# Patient Record
Sex: Female | Born: 1935 | Race: Black or African American | Hispanic: No | Smoking: Former smoker
Health system: Southern US, Community
[De-identification: ages and names within clinical notes are randomized; demographics above are authoritative.]

## PROBLEM LIST (undated history)

## (undated) DIAGNOSIS — E875 Hyperkalemia: Secondary | ICD-10-CM

## (undated) DIAGNOSIS — I10 Essential (primary) hypertension: Secondary | ICD-10-CM

## (undated) DIAGNOSIS — J449 Chronic obstructive pulmonary disease, unspecified: Secondary | ICD-10-CM

## (undated) DIAGNOSIS — F419 Anxiety disorder, unspecified: Secondary | ICD-10-CM

## (undated) DIAGNOSIS — K219 Gastro-esophageal reflux disease without esophagitis: Secondary | ICD-10-CM

## (undated) DIAGNOSIS — F4310 Post-traumatic stress disorder, unspecified: Secondary | ICD-10-CM

## (undated) DIAGNOSIS — M24519 Contracture, unspecified shoulder: Secondary | ICD-10-CM

## (undated) DIAGNOSIS — F0390 Unspecified dementia without behavioral disturbance: Secondary | ICD-10-CM

## (undated) DIAGNOSIS — K922 Gastrointestinal hemorrhage, unspecified: Secondary | ICD-10-CM

## (undated) DIAGNOSIS — N390 Urinary tract infection, site not specified: Secondary | ICD-10-CM

## (undated) DIAGNOSIS — F319 Bipolar disorder, unspecified: Secondary | ICD-10-CM

## (undated) DIAGNOSIS — D649 Anemia, unspecified: Secondary | ICD-10-CM

## (undated) DIAGNOSIS — M109 Gout, unspecified: Secondary | ICD-10-CM

## (undated) DIAGNOSIS — M810 Age-related osteoporosis without current pathological fracture: Secondary | ICD-10-CM

## (undated) DIAGNOSIS — N289 Disorder of kidney and ureter, unspecified: Secondary | ICD-10-CM

## (undated) HISTORY — DX: Essential (primary) hypertension: I10

## (undated) HISTORY — PX: TOTAL ABDOMINAL HYSTERECTOMY: SHX209

## (undated) HISTORY — DX: Bipolar disorder, unspecified: F31.9

## (undated) HISTORY — DX: Post-traumatic stress disorder, unspecified: F43.10

---

## 2007-10-11 ENCOUNTER — Ambulatory Visit (HOSPITAL_COMMUNITY): Admission: RE | Admit: 2007-10-11 | Discharge: 2007-10-11 | Payer: Self-pay | Admitting: Ophthalmology

## 2007-11-08 ENCOUNTER — Ambulatory Visit (HOSPITAL_COMMUNITY): Admission: RE | Admit: 2007-11-08 | Discharge: 2007-11-08 | Payer: Self-pay | Admitting: Ophthalmology

## 2008-09-05 ENCOUNTER — Emergency Department (HOSPITAL_COMMUNITY): Admission: EM | Admit: 2008-09-05 | Discharge: 2008-09-05 | Payer: Self-pay | Admitting: Emergency Medicine

## 2008-12-12 ENCOUNTER — Ambulatory Visit (HOSPITAL_COMMUNITY): Admission: RE | Admit: 2008-12-12 | Discharge: 2008-12-12 | Payer: Self-pay | Admitting: Internal Medicine

## 2009-07-12 HISTORY — PX: COLONOSCOPY: SHX174

## 2009-07-25 ENCOUNTER — Ambulatory Visit: Payer: Self-pay | Admitting: Gastroenterology

## 2009-07-25 DIAGNOSIS — K5909 Other constipation: Secondary | ICD-10-CM

## 2009-07-30 ENCOUNTER — Encounter: Payer: Self-pay | Admitting: Gastroenterology

## 2009-07-31 ENCOUNTER — Ambulatory Visit: Payer: Self-pay | Admitting: Gastroenterology

## 2009-07-31 ENCOUNTER — Ambulatory Visit (HOSPITAL_COMMUNITY): Admission: RE | Admit: 2009-07-31 | Discharge: 2009-07-31 | Payer: Self-pay | Admitting: Gastroenterology

## 2009-08-17 ENCOUNTER — Emergency Department (HOSPITAL_COMMUNITY): Admission: EM | Admit: 2009-08-17 | Discharge: 2009-08-17 | Payer: Self-pay | Admitting: Emergency Medicine

## 2009-10-08 ENCOUNTER — Encounter (INDEPENDENT_AMBULATORY_CARE_PROVIDER_SITE_OTHER): Payer: Self-pay | Admitting: *Deleted

## 2009-11-07 ENCOUNTER — Ambulatory Visit: Payer: Self-pay | Admitting: Gastroenterology

## 2009-12-13 ENCOUNTER — Ambulatory Visit (HOSPITAL_COMMUNITY): Admission: RE | Admit: 2009-12-13 | Discharge: 2009-12-13 | Payer: Self-pay | Admitting: Internal Medicine

## 2010-01-10 HISTORY — PX: UPPER GASTROINTESTINAL ENDOSCOPY: SHX188

## 2010-01-22 ENCOUNTER — Emergency Department (HOSPITAL_COMMUNITY): Admission: EM | Admit: 2010-01-22 | Discharge: 2010-01-22 | Payer: Self-pay | Admitting: Emergency Medicine

## 2010-02-02 ENCOUNTER — Emergency Department (HOSPITAL_COMMUNITY): Admission: EM | Admit: 2010-02-02 | Discharge: 2010-02-02 | Payer: Self-pay | Admitting: Emergency Medicine

## 2010-02-03 ENCOUNTER — Inpatient Hospital Stay (HOSPITAL_COMMUNITY): Admission: EM | Admit: 2010-02-03 | Discharge: 2010-02-13 | Payer: Self-pay | Admitting: Emergency Medicine

## 2010-02-05 ENCOUNTER — Ambulatory Visit: Payer: Self-pay | Admitting: Internal Medicine

## 2010-02-06 ENCOUNTER — Encounter (INDEPENDENT_AMBULATORY_CARE_PROVIDER_SITE_OTHER): Payer: Self-pay | Admitting: Internal Medicine

## 2010-02-06 ENCOUNTER — Ambulatory Visit: Payer: Self-pay | Admitting: Gastroenterology

## 2010-03-14 ENCOUNTER — Encounter: Payer: Self-pay | Admitting: Internal Medicine

## 2010-04-02 ENCOUNTER — Encounter: Payer: Self-pay | Admitting: Gastroenterology

## 2010-04-23 ENCOUNTER — Encounter (INDEPENDENT_AMBULATORY_CARE_PROVIDER_SITE_OTHER): Payer: Self-pay

## 2010-05-27 ENCOUNTER — Ambulatory Visit: Payer: Self-pay | Admitting: Gastroenterology

## 2010-05-27 ENCOUNTER — Encounter: Payer: Self-pay | Admitting: Urgent Care

## 2010-05-27 DIAGNOSIS — M79609 Pain in unspecified limb: Secondary | ICD-10-CM | POA: Insufficient documentation

## 2010-07-01 ENCOUNTER — Ambulatory Visit (HOSPITAL_COMMUNITY): Admission: RE | Admit: 2010-07-01 | Discharge: 2010-07-01 | Payer: Self-pay | Admitting: Internal Medicine

## 2010-08-11 ENCOUNTER — Encounter: Payer: Self-pay | Admitting: Urgent Care

## 2010-08-22 ENCOUNTER — Emergency Department (HOSPITAL_COMMUNITY): Admission: EM | Admit: 2010-08-22 | Discharge: 2010-08-22 | Payer: Self-pay | Admitting: Emergency Medicine

## 2010-11-02 ENCOUNTER — Encounter: Payer: Self-pay | Admitting: Internal Medicine

## 2010-11-11 NOTE — Medication Information (Signed)
Summary: MEAMUCIL  MEAMUCIL   Imported By: Rexene Alberts 08/11/2010 15:36:30  _____________________________________________________________________  External Attachment:    Type:   Image     Comment:   External Document  Appended Document: MEAMUCIL Ok Benefiber 2 by mouth daily , QS x 1 month, with 11 refills  Appended Document: MEAMUCIL Informed Lupita Leash at [facility], and called Rx to [facility].

## 2010-11-11 NOTE — Medication Information (Signed)
Summary: BENEFIBER SUGAR FREE  BENEFIBER SUGAR FREE   Imported By: Rexene Alberts 05/27/2010 14:42:07  _____________________________________________________________________  External Attachment:    Type:   Image     Comment:   External Document  Appended Document: BENEFIBER SUGAR FREE   Prescriptions: BENEFIBER  POWD (WHEAT DEXTRIN) 2 tsp in 6 oz of liquid daily  #QS x 1 month x 11   Entered and Authorized by:   Joselyn Arrow FNP-BC   Signed by:   Joselyn Arrow FNP-BC on 05/27/2010   Method used:   Printed then faxed to ...       [facility] (retail)       [address]       [place], [place]  [address]       Ph: [phone]       Fax: [phone]   Rx[facility]:   [id number]  Please fax to [facility].  Thanks

## 2010-11-11 NOTE — Assessment & Plan Note (Signed)
Summary: CONSTIPATION, GASTRITIS   Visit Type:  Follow-up Visit Primary Care Provider:  Felecia Shelling, M.D.  Chief Complaint:  Gastritis and Constipation.  History of Present Illness: No belly pain. Eating good. BMs: good. No vomiting or nausea. On fiber once daily. Rarely has to ask fo extra meds for BM.  Current Medications (verified): 1)  Omeprazole 20 Mg Cpdr (Omeprazole) .... Take 1 Tablet By Mouth Once A Day 2)  Divalproex Sodium 125 Mg Cpsp (Divalproex Sodium) .... Take Two Capsules By Mouth Twice Daily 3)  Simvastatin 20 Mg Tabs (Simvastatin) .... Take 1 Tab By Mouth At Bedtime 4)  Amlodipine Besylate 5 Mg Tabs (Amlodipine Besylate) .... Take 1 Tablet By Mouth Once A Day 5)  Benefiber  Powd (Wheat Dextrin) .... 2 Tsp in 6 Oz of Liquid Daily 6)  Clotrimazole 1 % Crea (Clotrimazole) .... As Directed 7)  Namenda 10 Mg Tabs (Memantine Hcl) .... Take 1 Tablet By Mouth Once A Day 8)  Zyprexa 2.5 Mg Tabs (Olanzapine) .... Take 1 Tablet By Mouth Once A Day 9)  Zyprexa 5 Mg Tabs (Olanzapine) .... Take 1 Tab By Mouth At Bedtime 10)  Exelon 4.6 Mg/24hr Pt24 (Rivastigmine) .... As Directed 11)  Tramadol Hcl 50 Mg Tabs (Tramadol Hcl) .... As Needed For Pain 12)  Acetaminophen 325 Mg Tabs (Acetaminophen) .... As Needed  Allergies (verified): No Known Drug Allergies  Past History:  Past Surgical History: Last updated: 07/25/2009 None  Past Medical History: After attack has problems with focusing and thinking straight BIPOLAR DISORDER POST TRAUMATIC STRESS DISORDER Hypertension 2011: EGD-NSAID GASTRITIS 2010: TCS- TICS, [facility] IH  Social History: Reviewed history from 07/25/2009 and no changes required. 1 daughter in [place]. Her niece, Arlington Calix, brought her to [place] so she can look after her. She is a Engineer, civil (consulting) at [employer]. No EtOh or tobacco. Quit smoking 2 years ago. Lives at [place].  Vital Signs:  Patient profile:   77 year old female Height:      64 inches Weight:       203 pounds Temp:     98.0 degrees F oral Pulse rate:   64 / minute BP sitting:   154 / 84  (left arm) Cuff size:   large  Vitals Entered By: Cloria Spring LPN (May 27, 2010 9:38 AM)  Physical Exam  General:  Well developed, well nourished, no acute distress. Head:  Normocephalic and atraumatic. Lungs:  Clear throughout to auscultation. Heart:  Regular rate and rhythm; no murmurs. Abdomen:  Bowel sounds normal.  Abdomen is soft, nontender, nondistended.  No rebound or guarding.   Pt examined in chair.  Impression & Recommendations:  Problem # 1:  CONSTIPATION, CHRONIC (ICD-564.09) Assessment Improved Continue Benefiber daily.  OPV in 12 mos.  CC: PCP  Problem # 2:  LEG PAIN, BILATERAL (ICD-729.5) Assessment: Comment Only See Dr. Felecia Shelling for leg pain and swelling.  Appended Document: CONSTIPATION, GASTRITIS 12 MONTH REMINDER OV IS IN THE COMPUTER/LAW  Appended Document: CONSTIPATION, GASTRITIS 1 YR REMINDER FOR OV F/U IS IN THE COMPUTER/LAW  Appended Document: Orders Update    Clinical Lists Changes  Orders: Added new Service order of Est. Patient Level II (09811) - Signed

## 2010-11-11 NOTE — Letter (Signed)
Summary: Recall Office Visit  [facility]  [address]   [place], [place] [address]   Phone: [phone]  Fax: [phone]      April 23, 2010   Carly Valenzuela [address] [place], [place]  [address] 1931/07/07   Dear [name],   According to our records, it is time for you to schedule a follow-up office visit with [place].   At your convenience, please call [phone] to schedule an office visit. If you have any questions, concerns, or feel that this letter is in error, we would appreciate your call.   Sincerely,    Hendricks Limes LPN  [facility] Ph: [phone]   Fax: [phone]

## 2010-11-11 NOTE — Letter (Signed)
Summary: Internal Other /fax to [facility]  Internal Other /fax to [facility]   Imported By: Cloria Spring LPN 95/62/1308 65:78:46  _____________________________________________________________________  External Attachment:    Type:   Image     Comment:   External Document

## 2010-11-11 NOTE — Consult Note (Signed)
Summary: Consultation Report  Consultation Report   Imported By: Minna Merritts 03/14/2010 16:19:11  _____________________________________________________________________  External Attachment:    Type:   Image     Comment:   External Document

## 2010-11-11 NOTE — Assessment & Plan Note (Signed)
Summary: FU OV 3 MO,SCREENING COLORECTAL,CONSTIPATION/AMS   Visit Type:  f/u Primary Care Provider:  Avon Gully  Chief Complaint:  follow up-constipation.  History of Present Illness: Ms. Carly Valenzuela is here for followup of constipation. She underwent colonoscopy by Dr. Darrick Penna in October 2010 and was found to have diverticulosis and internal hemorrhoids. For her constipation she was started on Benefiber 2 teaspoons 3 times a day. She complains of frequent loose stools since Benefiber started. She is afraid to leave her bedroom. She denies any abdominal pain, black stools, blood in stools, nausea vomiting, heartburn.  Current Medications (verified): 1)  Hydrocodone-Acetaminophen 5-500 Mg Tabs (Hydrocodone-Acetaminophen) .... Take 1 Tablet By Mouth Two Times A Day 2)  Levocarnitine 330 Mg Tabs (Levocarnitine) .... Take 1 Tablet By Mouth Three Times A Day 3)  Seroquel 25 Mg Tabs (Quetiapine Fumarate) .... Take 1 Tab By Mouth At Bedtime 4)  Omeprazole 20 Mg Cpdr (Omeprazole) .... Take 1 Tablet By Mouth Once A Day 5)  Lisinopril-Hydrochlorothiazide 20-25 Mg Tabs (Lisinopril-Hydrochlorothiazide) .... Take 1 Tablet By Mouth Once A Day 6)  Divalproex Sodium 125 Mg Cpsp (Divalproex Sodium) .... Take Three Capsules By Mouth Twice Daily 7)  Simvastatin 20 Mg Tabs (Simvastatin) .... Take 1 Tab By Mouth At Bedtime 8)  Amlodipine Besylate 5 Mg Tabs (Amlodipine Besylate) .... Take 1 Tablet By Mouth Once A Day 9)  Naprosyn 375 Mg Tabs (Naproxen) .... Take 1 Tablet By Mouth Two Times A Day As Needed For Pain 10)  Milk of Magnesia .... Take 10 Ml's Three Times A Day As  Needed For Constipation 11)  Benefiber  Powd (Wheat Dextrin) .... 2 Tsp By Mouth Tid  Allergies (verified): No Known Drug Allergies  Review of Systems      See HPI  Vital Signs:  Patient profile:   77 year old female Height:      64 inches Weight:      205 pounds BMI:     35.32 Temp:     97.5 degrees F oral Pulse rate:   64  / minute BP sitting:   142 / 80  (left arm) Cuff size:   regular  Vitals Entered By: Hendricks Limes LPN (November 07, 2009 10:42 AM)  Physical Exam  General:  Well developed, well nourished, no acute distress. Head:  Normocephalic and atraumatic. Eyes:  Sclera nonicteric. Mouth:  OP moist. Abdomen:  Bowel sounds normal.  Abdomen is soft, nontender, nondistended.  No rebound or guarding.  No hepatosplenomegaly, masses or hernias.  No abdominal bruits.  Extremities:  No clubbing, cyanosis, edema or deformities noted. Neurologic:  Alert and  oriented x4;  grossly normal neurologically. Skin:  Intact without significant lesions or rashes. Psych:  Alert and cooperative. Normal mood and affect.  Impression & Recommendations:  Problem # 1:  CONSTIPATION, CHRONIC (ICD-564.09)  Increased frequency of stools on Benefiber.  Change in once daily dose. If persistent loose stools, will back off completely. OV in 6 months with Dr. Darrick Penna.  Orders: Est. Patient Level II ([address]109)

## 2010-11-21 ENCOUNTER — Encounter (HOSPITAL_COMMUNITY): Payer: Self-pay

## 2010-11-21 ENCOUNTER — Other Ambulatory Visit (HOSPITAL_COMMUNITY): Payer: Self-pay | Admitting: Internal Medicine

## 2010-11-21 ENCOUNTER — Ambulatory Visit (HOSPITAL_COMMUNITY)
Admission: RE | Admit: 2010-11-21 | Discharge: 2010-11-21 | Disposition: A | Discharge status: Home / Self Care | Payer: Medicare Other | Source: Ambulatory Visit | Attending: Internal Medicine | Admitting: Internal Medicine

## 2010-11-21 DIAGNOSIS — M25569 Pain in unspecified knee: Secondary | ICD-10-CM | POA: Insufficient documentation

## 2010-11-21 DIAGNOSIS — M899 Disorder of bone, unspecified: Secondary | ICD-10-CM | POA: Insufficient documentation

## 2010-11-21 DIAGNOSIS — M1990 Unspecified osteoarthritis, unspecified site: Secondary | ICD-10-CM

## 2010-12-23 LAB — URINALYSIS, ROUTINE W REFLEX MICROSCOPIC
Glucose, UA: NEGATIVE mg/dL
Hgb urine dipstick: NEGATIVE
Protein, ur: NEGATIVE mg/dL
Specific Gravity, Urine: 1.01 (ref 1.005–1.030)

## 2010-12-23 LAB — COMPREHENSIVE METABOLIC PANEL
ALT: 14 U/L (ref 0–35)
AST: 20 U/L (ref 0–37)
Albumin: 4.1 g/dL (ref 3.5–5.2)
Alkaline Phosphatase: 133 U/L — ABNORMAL HIGH (ref 39–117)
GFR calc Af Amer: 47 mL/min — ABNORMAL LOW (ref >60)
Sodium: 134 mEq/L — ABNORMAL LOW (ref 135–145)
Total Bilirubin: 0.6 mg/dL (ref 0.3–1.2)
Total Protein: 7.9 g/dL (ref 6.0–8.3)

## 2010-12-23 LAB — CBC
HCT: 35.4 % — ABNORMAL LOW (ref 36.0–46.0)
Hemoglobin: 11.5 g/dL — ABNORMAL LOW (ref 12.0–15.0)
MCH: 27.3 pg (ref 26.0–34.0)
MCHC: 32.4 g/dL (ref 30.0–36.0)
MCV: 84.1 fL (ref 78.0–100.0)
Platelets: 203 10*3/uL (ref 150–400)
RDW: 15.1 % (ref 11.5–15.5)

## 2010-12-23 LAB — DIFFERENTIAL
Basophils Absolute: 0 10*3/uL (ref 0.0–0.1)
Eosinophils Absolute: 0.1 10*3/uL (ref 0.0–0.7)
Monocytes Absolute: 0.4 10*3/uL (ref 0.1–1.0)
Monocytes Relative: 8 % (ref 3–12)

## 2010-12-23 LAB — RAPID URINE DRUG SCREEN, HOSP PERFORMED
Barbiturates: NOT DETECTED
Cocaine: NOT DETECTED
Opiates: NOT DETECTED
Tetrahydrocannabinol: NOT DETECTED

## 2010-12-30 LAB — DIFFERENTIAL
Basophils Absolute: 0 10*3/uL (ref 0.0–0.1)
Basophils Absolute: 0 10*3/uL (ref 0.0–0.1)
Basophils Relative: 0 % (ref 0–1)
Basophils Relative: 0 % (ref 0–1)
Basophils Relative: 0 % (ref 0–1)
Basophils Relative: 0 % (ref 0–1)
Eosinophils Absolute: 0.2 10*3/uL (ref 0.0–0.7)
Eosinophils Absolute: 0 10*3/uL (ref 0.0–0.7)
Eosinophils Absolute: 0.2 10*3/uL (ref 0.0–0.7)
Eosinophils Absolute: 0.2 10*3/uL (ref 0.0–0.7)
Eosinophils Relative: 7 % — ABNORMAL HIGH (ref 0–5)
Eosinophils Relative: 0 % (ref 0–5)
Lymphocytes Relative: 24 % (ref 12–46)
Lymphs Abs: 1.1 10*3/uL (ref 0.7–4.0)
Lymphs Abs: 0.7 10*3/uL (ref 0.7–4.0)
Lymphs Abs: 0.8 10*3/uL (ref 0.7–4.0)
Lymphs Abs: 1 10*3/uL (ref 0.7–4.0)
Lymphs Abs: 1 10*3/uL (ref 0.7–4.0)
Monocytes Absolute: 0.2 10*3/uL (ref 0.1–1.0)
Monocytes Absolute: 0.5 10*3/uL (ref 0.1–1.0)
Monocytes Absolute: 0.8 10*3/uL (ref 0.1–1.0)
Monocytes Absolute: 0.9 10*3/uL (ref 0.1–1.0)
Monocytes Relative: 16 % — ABNORMAL HIGH (ref 3–12)
Monocytes Relative: 16 % — ABNORMAL HIGH (ref 3–12)
Monocytes Relative: 6 % (ref 3–12)
Neutro Abs: 4.6 10*3/uL (ref 1.7–7.7)
Neutro Abs: 3 10*3/uL (ref 1.7–7.7)
Neutrophils Relative %: 54 % (ref 43–77)
Neutrophils Relative %: 59 % (ref 43–77)
Neutrophils Relative %: 69 % (ref 43–77)
Neutrophils Relative %: 87 % — ABNORMAL HIGH (ref 43–77)

## 2010-12-30 LAB — URINE CULTURE: Colony Count: >=100000

## 2010-12-30 LAB — BASIC METABOLIC PANEL
CO2: 25 mEq/L (ref 19–32)
CO2: 25 mEq/L (ref 19–32)
CO2: 25 mEq/L (ref 19–32)
CO2: 27 mEq/L (ref 19–32)
CO2: 27 mEq/L (ref 19–32)
Calcium: 9.6 mg/dL (ref 8.4–10.5)
Calcium: 9.1 mg/dL (ref 8.4–10.5)
Chloride: 109 mEq/L (ref 96–112)
Chloride: 100 mEq/L (ref 96–112)
Chloride: 105 mEq/L (ref 96–112)
Chloride: 106 mEq/L (ref 96–112)
Creatinine, Ser: 1.6 mg/dL — ABNORMAL HIGH (ref 0.4–1.2)
Creatinine, Ser: 1.58 mg/dL — ABNORMAL HIGH (ref 0.4–1.2)
GFR calc Af Amer: 48 mL/min — ABNORMAL LOW (ref >60)
GFR calc Af Amer: 38 mL/min — ABNORMAL LOW (ref >60)
GFR calc Af Amer: 49 mL/min — ABNORMAL LOW (ref >60)
Glucose, Bld: 121 mg/dL — ABNORMAL HIGH (ref 70–99)
Glucose, Bld: 125 mg/dL — ABNORMAL HIGH (ref 70–99)
Potassium: 3.6 mEq/L (ref 3.5–5.1)
Potassium: 3.8 mEq/L (ref 3.5–5.1)
Sodium: 142 mEq/L (ref 135–145)
Sodium: 134 mEq/L — ABNORMAL LOW (ref 135–145)
Sodium: 139 mEq/L (ref 135–145)
Sodium: 140 mEq/L (ref 135–145)

## 2010-12-30 LAB — URINALYSIS, ROUTINE W REFLEX MICROSCOPIC
Bilirubin Urine: NEGATIVE
Bilirubin Urine: NEGATIVE
Glucose, UA: NEGATIVE mg/dL
Hgb urine dipstick: NEGATIVE
Ketones, ur: NEGATIVE mg/dL
Nitrite: POSITIVE — AB
Protein, ur: NEGATIVE mg/dL
Specific Gravity, Urine: 1.02 (ref 1.005–1.030)
Urobilinogen, UA: 0.2 mg/dL (ref 0.0–1.0)
Urobilinogen, UA: 0.2 mg/dL (ref 0.0–1.0)

## 2010-12-30 LAB — CBC
HCT: 27.6 % — ABNORMAL LOW (ref 36.0–46.0)
Hemoglobin: 8.6 g/dL — ABNORMAL LOW (ref 12.0–15.0)
Hemoglobin: 9.9 g/dL — ABNORMAL LOW (ref 12.0–15.0)
Hemoglobin: 8.6 g/dL — ABNORMAL LOW (ref 12.0–15.0)
Hemoglobin: 9.1 g/dL — ABNORMAL LOW (ref 12.0–15.0)
Hemoglobin: 9.5 g/dL — ABNORMAL LOW (ref 12.0–15.0)
Hemoglobin: 9.6 g/dL — ABNORMAL LOW (ref 12.0–15.0)
MCHC: 33.4 g/dL (ref 30.0–36.0)
MCHC: 33.8 g/dL (ref 30.0–36.0)
MCHC: 34.2 g/dL (ref 30.0–36.0)
MCHC: 34.8 g/dL (ref 30.0–36.0)
MCHC: 34.8 g/dL (ref 30.0–36.0)
MCV: 89 fL (ref 78.0–100.0)
MCV: 88.5 fL (ref 78.0–100.0)
MCV: 89 fL (ref 78.0–100.0)
Platelets: 151 10*3/uL (ref 150–400)
RBC: 2.87 MIL/uL — ABNORMAL LOW (ref 3.87–5.11)
RBC: 2.77 MIL/uL — ABNORMAL LOW (ref 3.87–5.11)
RBC: 3.03 MIL/uL — ABNORMAL LOW (ref 3.87–5.11)
RBC: 3.12 MIL/uL — ABNORMAL LOW (ref 3.87–5.11)
RBC: 3.13 MIL/uL — ABNORMAL LOW (ref 3.87–5.11)
RDW: 15.3 % (ref 11.5–15.5)
RDW: 14.4 % (ref 11.5–15.5)
RDW: 14.6 % (ref 11.5–15.5)
WBC: 4.4 10*3/uL (ref 4.0–10.5)
WBC: 5.1 10*3/uL (ref 4.0–10.5)
WBC: 5.7 10*3/uL (ref 4.0–10.5)

## 2010-12-30 LAB — URINE MICROSCOPIC-ADD ON

## 2010-12-30 LAB — CULTURE, BLOOD (SINGLE)

## 2010-12-30 LAB — COMPREHENSIVE METABOLIC PANEL
ALT: 9 U/L (ref 0–35)
AST: 14 U/L (ref 0–37)
Alkaline Phosphatase: 90 U/L (ref 39–117)
CO2: 26 mEq/L (ref 19–32)
GFR calc Af Amer: 44 mL/min — ABNORMAL LOW (ref >60)
Glucose, Bld: 107 mg/dL — ABNORMAL HIGH (ref 70–99)
Potassium: 3.5 mEq/L (ref 3.5–5.1)
Sodium: 137 mEq/L (ref 135–145)
Total Protein: 6.1 g/dL (ref 6.0–8.3)

## 2010-12-30 LAB — VITAMIN B12: Vitamin B-12: 487 pg/mL (ref 211–911)

## 2010-12-30 LAB — TSH: TSH: 3.017 u[IU]/mL (ref 0.350–4.500)

## 2010-12-30 LAB — RETICULOCYTES
Retic Count, Absolute: 69.5 10*3/uL (ref 19.0–186.0)
Retic Ct Pct: 2.5 % (ref 0.4–3.1)

## 2010-12-30 LAB — VALPROIC ACID LEVEL: Valproic Acid Lvl: 95 ug/mL (ref 50.0–100.0)

## 2010-12-30 LAB — FOLATE: Folate: 4.8 ng/mL

## 2010-12-31 LAB — BASIC METABOLIC PANEL
BUN: 39 mg/dL — ABNORMAL HIGH (ref 6–23)
CO2: 26 mEq/L (ref 19–32)
Chloride: 105 mEq/L (ref 96–112)
Creatinine, Ser: 1.68 mg/dL — ABNORMAL HIGH (ref 0.4–1.2)
Potassium: 4.1 mEq/L (ref 3.5–5.1)

## 2010-12-31 LAB — CBC
HCT: 31.3 % — ABNORMAL LOW (ref 36.0–46.0)
MCHC: 33.8 g/dL (ref 30.0–36.0)
MCV: 89.3 fL (ref 78.0–100.0)
Platelets: 130 10*3/uL — ABNORMAL LOW (ref 150–400)
WBC: 4.5 10*3/uL (ref 4.0–10.5)

## 2010-12-31 LAB — URINE MICROSCOPIC-ADD ON

## 2010-12-31 LAB — URINE CULTURE

## 2010-12-31 LAB — URINALYSIS, ROUTINE W REFLEX MICROSCOPIC
Bilirubin Urine: NEGATIVE
Glucose, UA: NEGATIVE mg/dL
Hgb urine dipstick: NEGATIVE
Specific Gravity, Urine: 1.015 (ref 1.005–1.030)
pH: 7 (ref 5.0–8.0)

## 2010-12-31 LAB — POCT CARDIAC MARKERS

## 2010-12-31 LAB — DIFFERENTIAL
Basophils Relative: 1 % (ref 0–1)
Eosinophils Absolute: 0.1 10*3/uL (ref 0.0–0.7)
Eosinophils Relative: 3 % (ref 0–5)
Lymphs Abs: 1.5 10*3/uL (ref 0.7–4.0)
Monocytes Relative: 8 % (ref 3–12)
Neutrophils Relative %: 54 % (ref 43–77)

## 2010-12-31 LAB — HEPATIC FUNCTION PANEL
Alkaline Phosphatase: 104 U/L (ref 39–117)
Indirect Bilirubin: 0.4 mg/dL (ref 0.3–0.9)
Total Bilirubin: 0.5 mg/dL (ref 0.3–1.2)
Total Protein: 7 g/dL (ref 6.0–8.3)

## 2010-12-31 LAB — LIPASE, BLOOD: Lipase: 20 U/L (ref 11–59)

## 2011-01-12 ENCOUNTER — Other Ambulatory Visit (HOSPITAL_COMMUNITY): Payer: Self-pay | Admitting: Internal Medicine

## 2011-01-12 DIAGNOSIS — Z139 Encounter for screening, unspecified: Secondary | ICD-10-CM

## 2011-01-19 ENCOUNTER — Ambulatory Visit (HOSPITAL_COMMUNITY)
Admission: RE | Admit: 2011-01-19 | Discharge: 2011-01-19 | Disposition: A | Discharge status: Home / Self Care | Payer: Medicare Other | Source: Ambulatory Visit | Attending: Internal Medicine | Admitting: Internal Medicine

## 2011-01-19 DIAGNOSIS — Z1231 Encounter for screening mammogram for malignant neoplasm of breast: Secondary | ICD-10-CM | POA: Insufficient documentation

## 2011-01-19 DIAGNOSIS — Z139 Encounter for screening, unspecified: Secondary | ICD-10-CM

## 2011-01-22 ENCOUNTER — Telehealth: Payer: Self-pay

## 2011-01-22 NOTE — Telephone Encounter (Signed)
Called to [facility] [place] at [facility].

## 2011-01-22 NOTE — Telephone Encounter (Signed)
Yes

## 2011-01-22 NOTE — Telephone Encounter (Signed)
Fax from [facility]. Benefiber caplets unavailable. Is it OK to change to Metamucil caps  2 po daily?

## 2011-02-17 MED ORDER — INULIN 2 G PO CHEW: 2.0000 g | CHEWABLE_TABLET | Freq: Two times a day (BID) | ORAL | Status: AC

## 2011-02-17 NOTE — Telephone Encounter (Signed)
Addended by: Lorenza Burton on: 02/17/2011 12:04 PM   Modules accepted: Orders

## 2011-02-17 NOTE — Telephone Encounter (Signed)
Called Rx to [facility] @ [facility].

## 2011-02-17 NOTE — Telephone Encounter (Signed)
Metamucil caps not available per pharm Will change

## 2011-05-14 ENCOUNTER — Encounter: Payer: Self-pay | Admitting: Gastroenterology

## 2011-07-02 ENCOUNTER — Encounter: Payer: Self-pay | Admitting: Gastroenterology

## 2011-07-02 ENCOUNTER — Ambulatory Visit (INDEPENDENT_AMBULATORY_CARE_PROVIDER_SITE_OTHER): Payer: Medicare Other | Admitting: Gastroenterology

## 2011-07-02 DIAGNOSIS — K5909 Other constipation: Secondary | ICD-10-CM

## 2011-07-02 DIAGNOSIS — M25561 Pain in right knee: Secondary | ICD-10-CM

## 2011-07-02 DIAGNOSIS — M25569 Pain in unspecified knee: Secondary | ICD-10-CM

## 2011-07-02 DIAGNOSIS — M25562 Pain in left knee: Secondary | ICD-10-CM | POA: Insufficient documentation

## 2011-07-02 MED ORDER — ACETAMINOPHEN 325 MG PO TABS: 650.0000 mg | ORAL_TABLET | ORAL | Status: AC | PRN

## 2011-07-02 MED ORDER — IBUPROFEN 400 MG PO TABS: ORAL_TABLET | ORAL | Status: DC

## 2011-07-02 NOTE — Progress Notes (Signed)
  Subjective:    Patient ID: Carly Valenzuela, female    DOB: 1931/04/26, 77 y.o.   MRN: [id number]  PCP: Felecia Shelling  HPI Pt seen in follow up for constipation. Not having good BMs on MLX qd. Spoke with [name] and she  states pt sleeps during the day and is up at night. Pt c/o bilateral knee pain. Pt unable to give a complete Hx.   Past Medical History  Diagnosis Date  . Bipolar 1 disorder   . PTSD (post-traumatic stress disorder)   . HTN (hypertension)    Past Surgical History  Procedure Date  . Colonoscopy OCT 2010    [place] TICS, SML IH  . Upper gastrointestinal endoscopy APR 2011    NSAID GASTRITIS, INFLAMMATORY POLYP  . Total abdominal hysterectomy     No Known Allergies  Current Outpatient Prescriptions  Medication Sig Dispense Refill  . acetaminophen (TYLENOL) 325 MG tablet Take 2 tablets (650 mg total) by mouth every 4 (four) hours as needed for pain. 2 PO Q6H PRN FOR KNEE PAIN  100 tablet  11  . divalproex (DEPAKOTE SPRINKLE) 125 MG capsule Take 125 mg by mouth 2 (two) times daily.       . EXELON 4.6 MG/24HR Place 1 patch onto the skin daily.       . furosemide (LASIX) 40 MG tablet Take 40 mg by mouth 2 (two) times daily.       Marland Kitchen ibuprofen (ADVIL,MOTRIN) 400 MG tablet 1 PO BID FOR 10 DAYS WITH FOOD OR MILK THEN AS NEEDED FOR KNEE PAIN    . Inulin (FIBERCHOICE) 2 G CHEW Chew 1 tablet (2 g total) by mouth 2 (two) times daily.    Marland Kitchen losartan (COZAAR) 100 MG tablet Take 100 mg by mouth daily.       . mirtazapine (REMERON) 15 MG tablet Take 15 mg by mouth at bedtime. 1/2 tablet       . NAMENDA 10 MG tablet Take 10 mg by mouth daily.       . naproxen (NAPROSYN) 500 MG tablet Take 500 mg by mouth 2 (two) times daily with a meal.       . OLANZapine (ZYPREXA) 5 MG tablet Take 5 mg by mouth at bedtime.       Marland Kitchen omeprazole (PRILOSEC) 20 MG capsule Take 20 mg by mouth daily.       . potassium chloride SA (K-DUR,KLOR-CON) 20 MEQ tablet 20 mEq daily. 2 tablets      . simvastatin (ZOCOR)  20 MG tablet Take 20 mg by mouth at bedtime.       . traMADol (ULTRAM) 50 MG tablet Take 50 mg by mouth every 6 (six) hours as needed.           Review of Systems  Unable to perform ROS      Objective:   Physical Exam  Vitals reviewed. Constitutional: She appears well-developed and well-nourished. No distress.  HENT:  Head: Normocephalic and atraumatic.  Cardiovascular: Normal rate, regular rhythm and normal heart sounds.   Pulmonary/Chest: Effort normal and breath sounds normal.  Abdominal: Soft. Bowel sounds are normal. She exhibits no distension. There is no tenderness.       Exam limited pt in the chair  Musculoskeletal: She exhibits tenderness (mild).       bil knees with mild swelling and warm to the touch  Neurological: She is alert.          Assessment & Plan:

## 2011-07-02 NOTE — Assessment & Plan Note (Signed)
2o to OA v. Gout.   Add ibu 400 mg bid for 10 days then prn WITH FOOD OR MILK. D/C NAPROXEN. TYLENOL PRN. CONTINUE OMP. OPV IN 2 WEEKS for knee pain and insomnia.

## 2011-07-02 NOTE — Progress Notes (Signed)
Cc to PCP 

## 2011-07-02 NOTE — Progress Notes (Signed)
Reminder in epic to follow up in 6 months/e30

## 2011-07-02 NOTE — Assessment & Plan Note (Signed)
Sx not ideally controlled.  Increase MLX to BID. Consider adding Amitiza. OPV in 6 mos. Discussed care with MS. Rucker and her niece, Philis Nettle.

## 2011-07-14 LAB — CBC
MCHC: 33
MCV: 87.7
Platelets: 199
WBC: 4.2

## 2011-07-14 LAB — DIFFERENTIAL
Basophils Relative: 0
Eosinophils Absolute: 0.1
Neutrophils Relative %: 65

## 2011-07-14 LAB — BASIC METABOLIC PANEL
BUN: 19
CO2: 26
Calcium: 9.4
GFR calc non Af Amer: 44 — ABNORMAL LOW
Glucose, Bld: 110 — ABNORMAL HIGH
Potassium: 5

## 2011-07-14 LAB — URINALYSIS, ROUTINE W REFLEX MICROSCOPIC
Nitrite: NEGATIVE
Specific Gravity, Urine: 1.01
pH: 6

## 2011-07-14 LAB — URINE MICROSCOPIC-ADD ON

## 2011-07-17 LAB — BASIC METABOLIC PANEL
Calcium: 8.8
GFR calc Af Amer: 50 — ABNORMAL LOW
GFR calc non Af Amer: 41 — ABNORMAL LOW
Sodium: 135

## 2011-07-17 LAB — HEMOGLOBIN AND HEMATOCRIT, BLOOD: HCT: 32.1 — ABNORMAL LOW

## 2012-01-04 ENCOUNTER — Encounter: Payer: Self-pay | Admitting: Gastroenterology

## 2012-02-05 ENCOUNTER — Other Ambulatory Visit (HOSPITAL_COMMUNITY): Payer: Self-pay | Admitting: Internal Medicine

## 2012-02-05 DIAGNOSIS — Z139 Encounter for screening, unspecified: Secondary | ICD-10-CM

## 2012-02-09 ENCOUNTER — Ambulatory Visit (HOSPITAL_COMMUNITY): Payer: Medicare Other

## 2012-02-11 ENCOUNTER — Encounter: Payer: Self-pay | Admitting: Gastroenterology

## 2012-02-11 ENCOUNTER — Ambulatory Visit (INDEPENDENT_AMBULATORY_CARE_PROVIDER_SITE_OTHER): Payer: Medicare Other | Admitting: Gastroenterology

## 2012-02-11 VITALS — BP 162/83 | HR 99 | Temp 97.4°F | Ht 62.0 in | Wt 190.4 lb

## 2012-02-11 DIAGNOSIS — K5909 Other constipation: Secondary | ICD-10-CM

## 2012-02-11 NOTE — Patient Instructions (Signed)
CONTINUE FIBER AND MIRALAX.  FOLLOW UP IN 6 MOS.

## 2012-02-11 NOTE — Progress Notes (Signed)
Reminder in epic to follow up in 6 months with SF ONLY °

## 2012-02-11 NOTE — Assessment & Plan Note (Signed)
RESOLVED WITH FIBER AND MIRALAX.  PT GAINED 11 LBS SINCE SEP 2012. OPV IN 6 MOS.

## 2012-02-11 NOTE — Progress Notes (Signed)
Faxed to PCP

## 2012-02-11 NOTE — Progress Notes (Signed)
Subjective:    Patient ID: Carly Valenzuela, female    DOB: [birth year], 78 y.o.   MRN: [id number]  PCP: FANTA  HPI BMs ARE GOOD. STILL HAVING KNEE PAIN. APPETITE GOOD. SLEEPING AT NIGHT NOW. NO QUESTIONS OR CONCERNS.  Past Medical History  Diagnosis Date  . Bipolar 1 disorder   . PTSD (post-traumatic stress disorder)   . HTN (hypertension)     Past Surgical History  Procedure Date  . Colonoscopy OCT 2010    [place] TICS, SML IH  . Upper gastrointestinal endoscopy APR 2011    NSAID GASTRITIS, INFLAMMATORY POLYP  . Total abdominal hysterectomy     No Known Allergies  Current Outpatient Prescriptions  Medication Sig Dispense Refill  . acetaminophen (TYLENOL) 325 MG tablet Take 2 tablets (650 mg total) by mouth every 4 (four) hours as needed for pain. 2 PO Q6H PRN FOR KNEE PAIN  100 tablet  11  . divalproex (DEPAKOTE SPRINKLE) 125 MG capsule Take 125 mg by mouth 2 (two) times daily.       . EXELON 4.6 MG/24HR Place 1 patch onto the skin daily.       . furosemide (LASIX) 40 MG tablet Take 40 mg by mouth 2 (two) times daily.       Marland Kitchen ibuprofen (ADVIL,MOTRIN) 400 MG tablet 1 PO BID FOR 10 DAYS WITH FOOD OR MILK THEN AS NEEDED FOR KNEE PAIN    . Inulin (FIBERCHOICE) 2 G CHEW Chew 1 tablet (2 g total) by mouth 2 (two) times daily.    Marland Kitchen losartan (COZAAR) 100 MG tablet Take 100 mg by mouth daily.       . mirtazapine (REMERON) 15 MG tablet Take 15 mg by mouth at bedtime. 1/2 tablet       . NAMENDA 10 MG tablet Take 10 mg by mouth daily.       . naproxen (NAPROSYN) 500 MG tablet Take 500 mg by mouth 2 (two) times daily with a meal.       . OLANZapine (ZYPREXA) 5 MG tablet Take 2.5 mg by mouth at bedtime.       Marland Kitchen omeprazole (PRILOSEC) 20 MG capsule Take 20 mg by mouth daily.       . polyethylene glycol (MIRALAX / GLYCOLAX) packet Take 17 g by mouth daily.      . potassium chloride SA (K-DUR,KLOR-CON) 20 MEQ tablet 20 mEq daily. 2 tablets      . simvastatin (ZOCOR) 20 MG tablet Take 20 mg by  mouth at bedtime.       . traMADol (ULTRAM) 50 MG tablet Take 50 mg by mouth every 6 (six) hours as needed.           Review of Systems     Objective:   Physical Exam  Constitutional: No distress.  HENT:  Head: Normocephalic and atraumatic.  Mouth/Throat: Oropharynx is clear and moist. No oropharyngeal exudate.  Eyes: Pupils are equal, round, and reactive to light. No scleral icterus.  Neck: Neck supple.  Cardiovascular: Normal rate, regular rhythm and normal heart sounds.   Pulmonary/Chest: Effort normal and breath sounds normal. No respiratory distress.  Abdominal: Soft. Bowel sounds are normal. She exhibits no distension. There is no tenderness.       LIMITED EXAM PT IN CHAIR   Neurological: She is alert.       NO  NEW FOCAL DEFICITS   Psychiatric: She has a normal mood and affect.  Assessment & Plan:

## 2012-02-12 IMAGING — CT CT HEAD W/O CM
3 of 6 series · 15 of 47 positions shown, 17 images · non-contrast
Comparison: 01/22/2010

CT HEAD

CLINICAL DATA: Status post , pole.

CT HEAD WITHOUT CONTRAST
CT CERVICAL SPINE WITHOUT CONTRAST
TECHNIQUE: Multidetector CT imaging of the head and cervical spine
was performed following the standard protocol without intravenous
contrast.  Multiplanar CT image reconstructions of the cervical
spine were also generated.

[Series 3: headseq 2.4 h60s · axial · 0.43mm/px · z∈[+189,+324]mm · 7 of 72 slices shown, 9 images]
[im 9/72  brain]
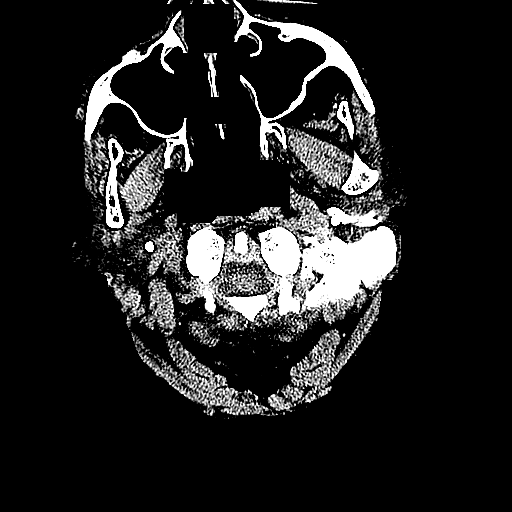
[im 9/72  bone]
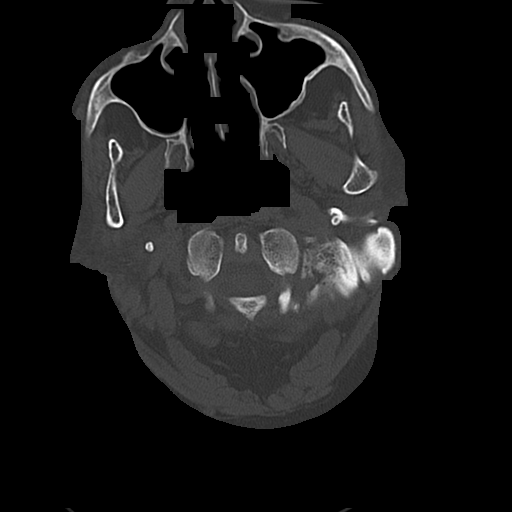
[im 18/72  brain]
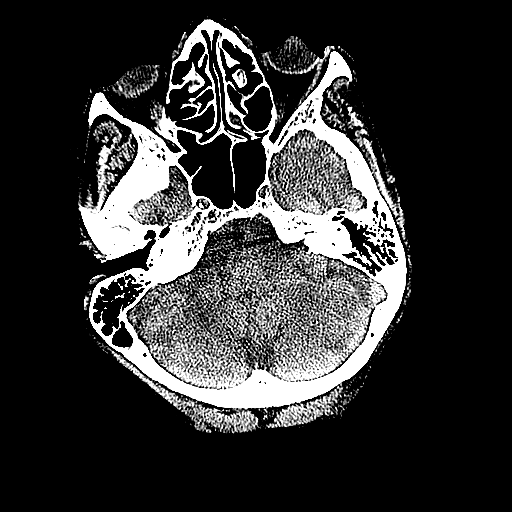
[im 27/72  brain]
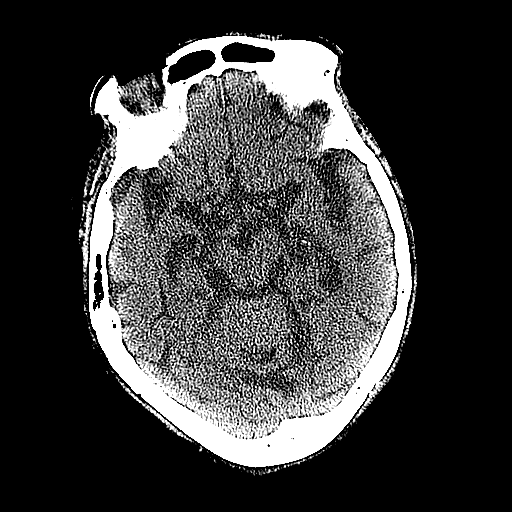
[im 36/72  brain]
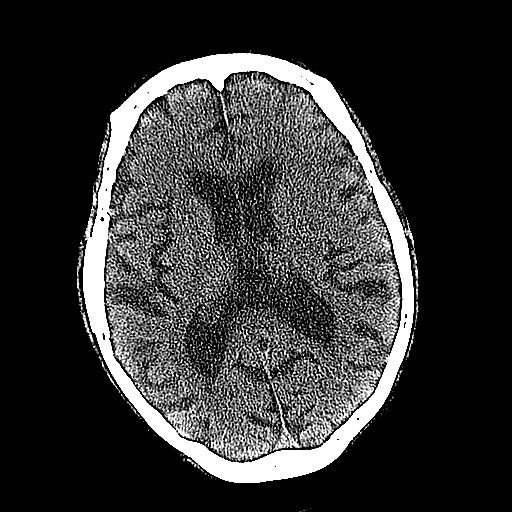
[im 45/72  brain]
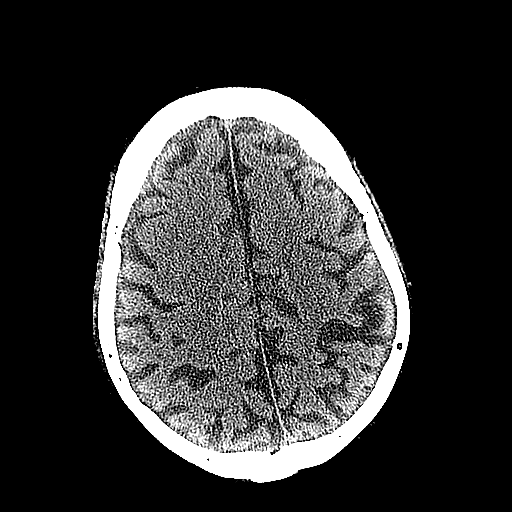
[im 45/72  bone]
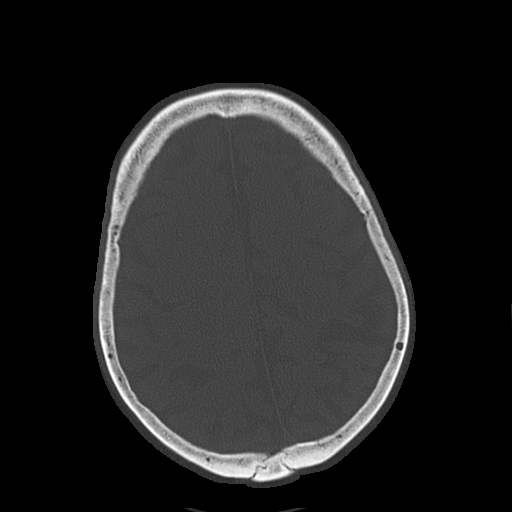
[im 54/72  brain]
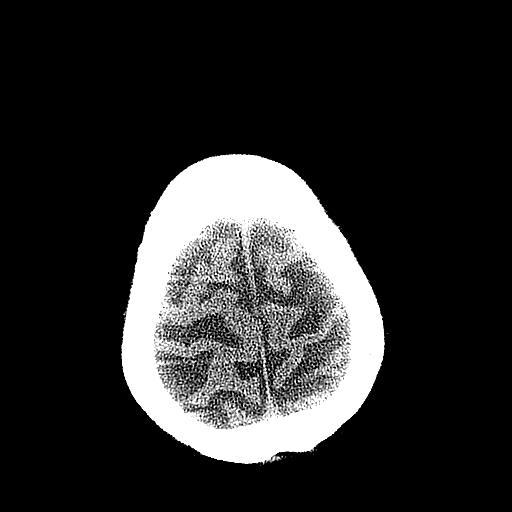
[im 63/72  brain]
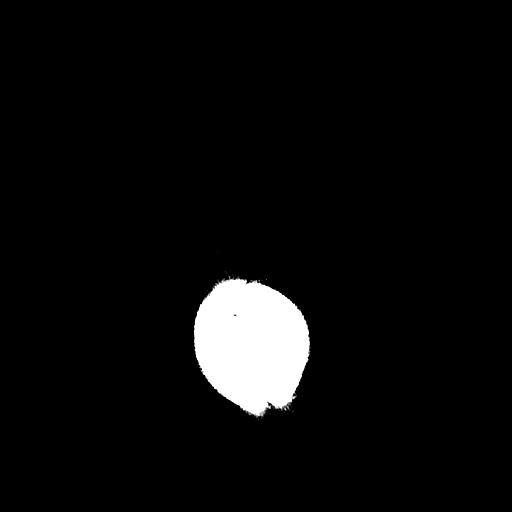

[Series 4: cervical st 2.0 b31s · axial · 0.28mm/px · z∈[+67,+137]mm · 5 of 71 slices shown]
[im 9/71  brain]
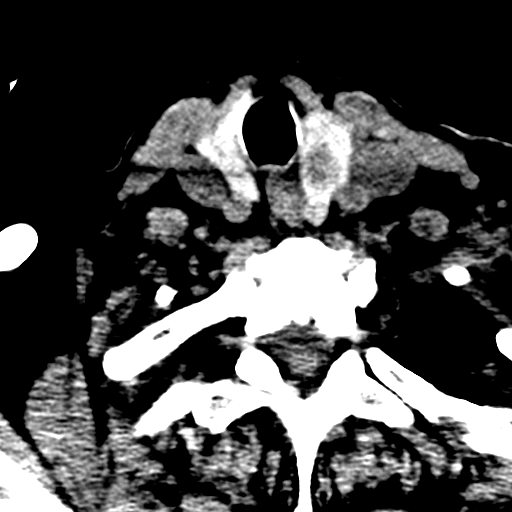
[im 18/71  brain]
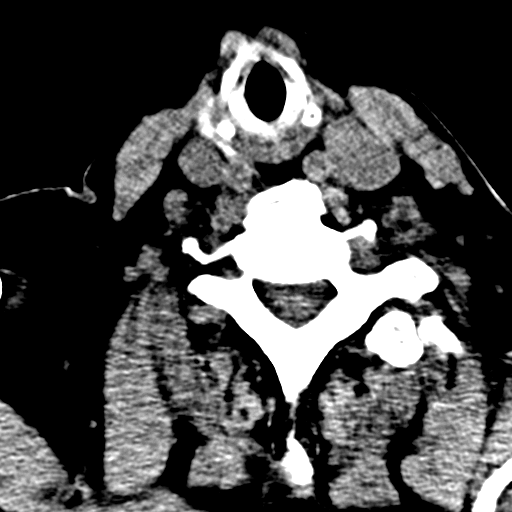
[im 27/71  brain]
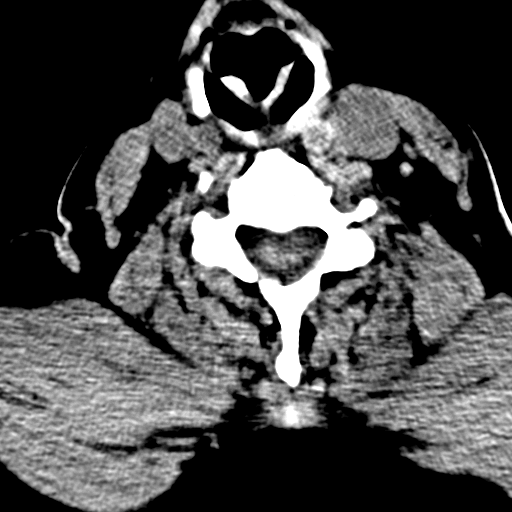
[im 36/71  brain]
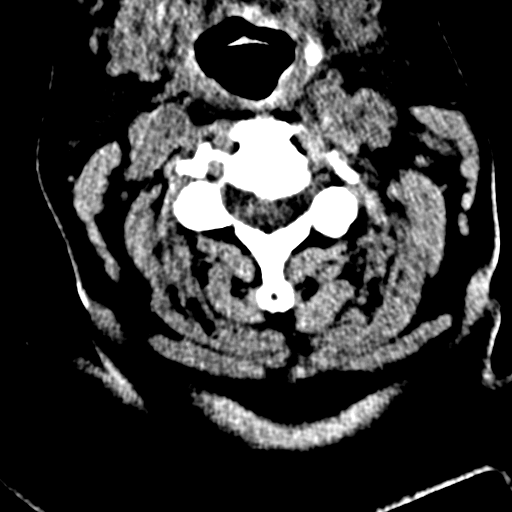
[im 44/71  brain]
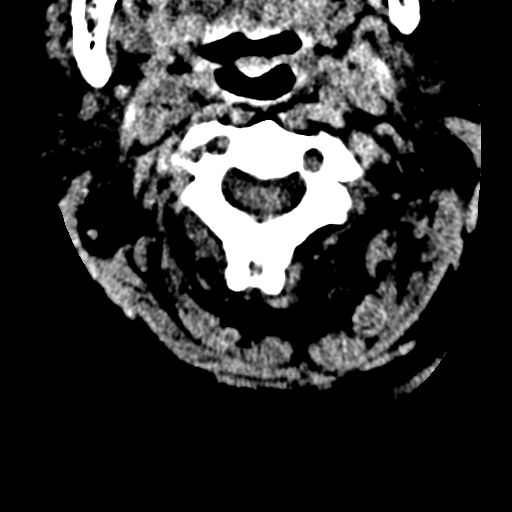

[Series 7: cervical coro (id) · coronal · 0.17mm/px · 3 of 37 slices shown]
[im 13/37  brain]
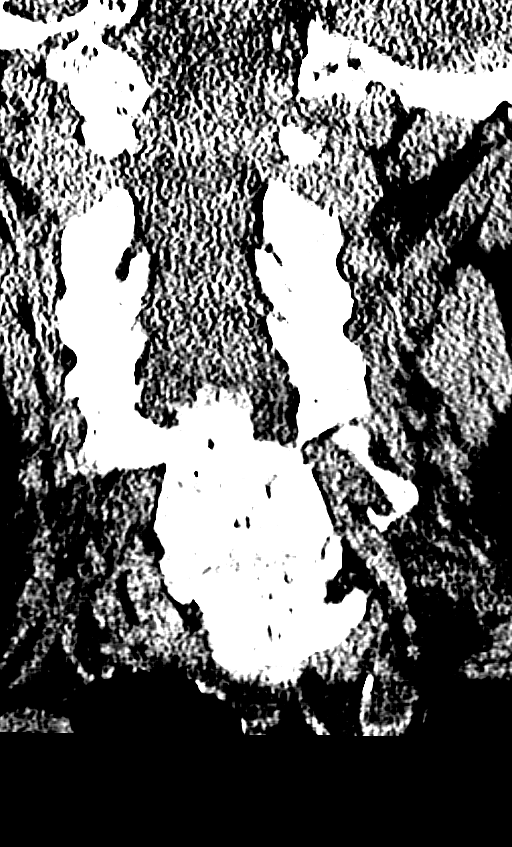
[im 17/37  brain]
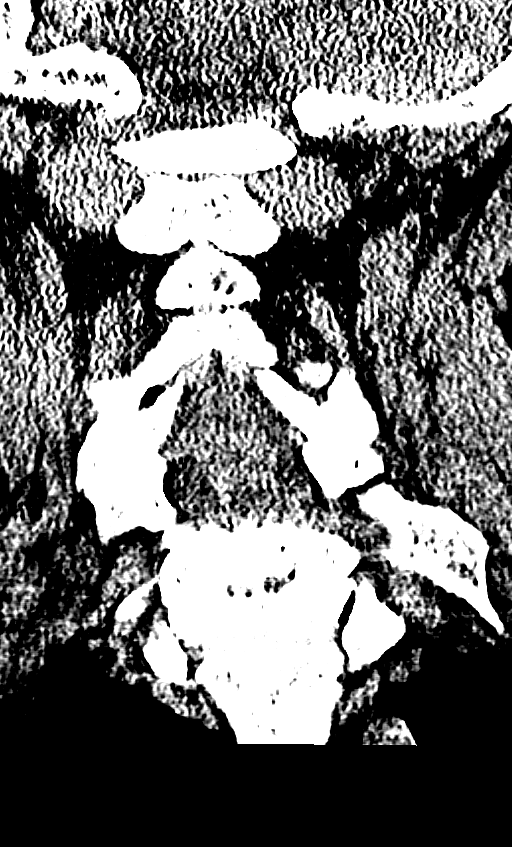
[im 21/37  brain]
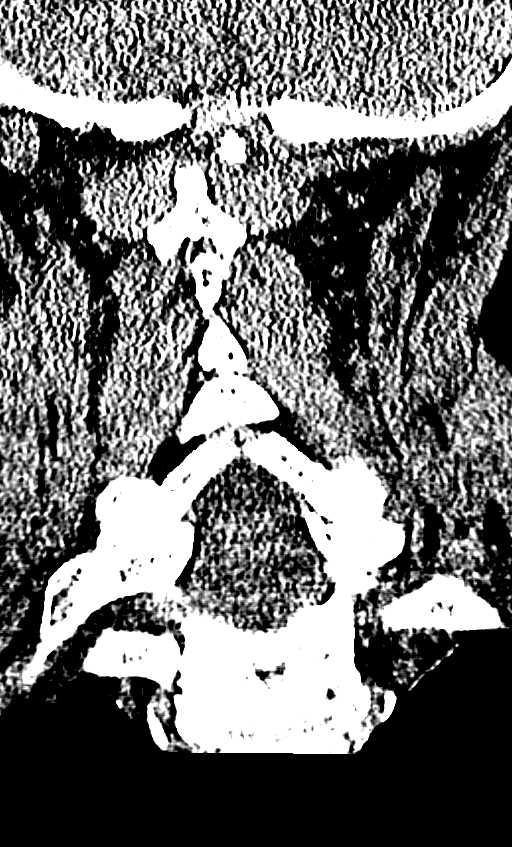

[15 of 47 positions shown; findings below may reference images not displayed]

FINDINGS: There is prominence of the sulci and ventricles
consistent with brain atrophy.

There is diffuse patchy low density throughout the subcortical and
periventricular white matter consistent with chronic small vessel
ischemic change.

There is no evidence for acute brain infarct, hemorrhage or mass.

Mild mucosal thickening involving the posterior right maxillary
sinus measures 5.3 mm, image #8.

The mastoid air cells are clear.

The skull is intact.
IMPRESSION: 1.  No acute intracranial abnormalities.
2.  Small vessel ischemic disease and brain atrophy.

CT CERVICAL SPINE
FINDINGS: The alignment of the cervical spine is normal.

The facet joints are all aligned.

The vertebral body heights are well maintained.

There is multilevel disc space narrowing and anterior spur
formation.  This is most severe at C5-6 and C6-7.

No fractures are identified.
IMPRESSION: 1.  No acute findings.

2.  Cervical spondylosis.

## 2012-07-07 ENCOUNTER — Encounter: Payer: Self-pay | Admitting: Gastroenterology

## 2012-08-03 ENCOUNTER — Ambulatory Visit (INDEPENDENT_AMBULATORY_CARE_PROVIDER_SITE_OTHER): Payer: Medicare Other | Admitting: Gastroenterology

## 2012-08-03 ENCOUNTER — Encounter: Payer: Self-pay | Admitting: Gastroenterology

## 2012-08-03 VITALS — BP 139/75 | HR 106 | Temp 97.0°F | Ht 66.0 in | Wt 206.8 lb

## 2012-08-03 DIAGNOSIS — K5909 Other constipation: Secondary | ICD-10-CM

## 2012-08-03 NOTE — Progress Notes (Signed)
Faxed to PCP

## 2012-08-03 NOTE — Assessment & Plan Note (Signed)
DOING WELL ON FIBER AND MIRALAX  CONTINUE BOTH. OPV IN ONE YEAR.

## 2012-08-03 NOTE — Progress Notes (Signed)
  Subjective:    Patient ID: Carly Valenzuela, female    DOB: 1931-07-20, 78 y.o.   MRN: [id number]  PCP: FANTA  HPI BOWELS ARE GOOD. TAKING FIBER AND MIRALAX.  Past Medical History  Diagnosis Date  . Bipolar 1 disorder   . PTSD (post-traumatic stress disorder)   . HTN (hypertension)     Past Surgical History  Procedure Date  . Colonoscopy OCT 2010    [place] TICS, SML IH  . Upper gastrointestinal endoscopy APR 2011    NSAID GASTRITIS, INFLAMMATORY POLYP  . Total abdominal hysterectomy     No Known Allergies  Current Outpatient Prescriptions  Medication Sig Dispense Refill  . divalproex (DEPAKOTE SPRINKLE) 125 MG capsule Take 125 mg by mouth 2 (two) times daily.       . EXELON 4.6 MG/24HR Place 1 patch onto the skin daily.       . furosemide (LASIX) 40 MG tablet Take 40 mg by mouth 2 (two) times daily.       Marland Kitchen ibuprofen (ADVIL,MOTRIN) 400 MG tablet 1 PO BID FOR 10 DAYS WITH FOOD OR MILK THEN AS NEEDED FOR KNEE PAIN    . Inulin (FIBERCHOICE) 2 G CHEW Chew 1 tablet (2 g total) by mouth 2 (two) times daily.    Marland Kitchen losartan (COZAAR) 100 MG tablet Take 100 mg by mouth daily.       . mirtazapine (REMERON) 15 MG tablet Take 15 mg by mouth at bedtime. 1/2 tablet       . NAMENDA 10 MG tablet Take 10 mg by mouth daily.       .        . OLANZapine (ZYPREXA) 5 MG tablet Take 2.5 mg by mouth at bedtime.       Marland Kitchen omeprazole (PRILOSEC) 20 MG capsule Take 20 mg by mouth daily.       . polyethylene glycol (MIRALAX / GLYCOLAX) packet Take 17 g by mouth daily.      . potassium chloride SA (K-DUR,KLOR-CON) 20 MEQ tablet 20 mEq daily. 2 tablets      . simvastatin (ZOCOR) 20 MG tablet Take 20 mg by mouth at bedtime.       . traMADol (ULTRAM) 50 MG tablet Take 50 mg by mouth every 6 (six) hours as needed.           Review of Systems     Objective:   Physical Exam  Vitals reviewed. Constitutional: She appears well-nourished. No distress.  HENT:  Head: Normocephalic and atraumatic.    Cardiovascular: Normal rate, regular rhythm and normal heart sounds.   Pulmonary/Chest: Effort normal and breath sounds normal. No respiratory distress.  Abdominal: Soft. Bowel sounds are normal. She exhibits no distension. There is no tenderness.       EXAM LIMITED-PT IN CHAIR   Neurological: She is alert.       NO  NEW FOCAL DEFICITS   Psychiatric: She has a normal mood and affect.          Assessment & Plan:

## 2012-08-03 NOTE — Patient Instructions (Signed)
CONTINUE MIRALAX AND FIBER.   FOLLOW UP IN ONE YEAR.  

## 2012-08-25 NOTE — Progress Notes (Signed)
Reminder in epic to follow up in one year °

## 2013-08-03 ENCOUNTER — Encounter (INDEPENDENT_AMBULATORY_CARE_PROVIDER_SITE_OTHER): Payer: Self-pay

## 2013-08-03 ENCOUNTER — Encounter: Payer: Self-pay | Admitting: Gastroenterology

## 2013-08-03 ENCOUNTER — Ambulatory Visit (INDEPENDENT_AMBULATORY_CARE_PROVIDER_SITE_OTHER): Payer: Medicare Other | Admitting: Gastroenterology

## 2013-08-03 VITALS — BP 129/72 | HR 100 | Temp 98.3°F

## 2013-08-03 DIAGNOSIS — K5909 Other constipation: Secondary | ICD-10-CM

## 2013-08-03 NOTE — Patient Instructions (Signed)
CONTINUE MIRALAX AND FIBER.   FOLLOW UP IN ONE YEAR.

## 2013-08-03 NOTE — Progress Notes (Signed)
  Subjective:    Patient ID: Carly Valenzuela, female    DOB: [birth year], 79 y.o.   MRN: [id number]  Avon Gully, MD  HPI NO QUESTIONS OR COMPLAINTS. APPETITE: EATING GOOD. PT DENIES FEVER, CHILLS, BRBPR, nausea, vomiting, melena, diarrhea, constipation, abd pain, problems swallowing, OR heartburn or indigestion.  Past Medical History  Diagnosis Date  . Bipolar 1 disorder   . PTSD (post-traumatic stress disorder)   . HTN (hypertension)    Past Surgical History  Procedure Laterality Date  . Colonoscopy  OCT 2010    [place] TICS, SML IH  . Upper gastrointestinal endoscopy  APR 2011    NSAID GASTRITIS, INFLAMMATORY POLYP  . Total abdominal hysterectomy     No Known Allergies  Current Outpatient Prescriptions  Medication Sig Dispense Refill  . divalproex (DEPAKOTE SPRINKLE) 125 MG capsule Take 125 mg by mouth 2 (two) times daily.       . EXELON 4.6 MG/24HR Place 1 patch onto the skin daily.       . furosemide (LASIX) 40 MG tablet Take 40 mg by mouth 2 (two) times daily.       Marland Kitchen ibuprofen (ADVIL,MOTRIN) 400 MG tablet 1 PO BID FOR 10 DAYS WITH FOOD OR MILK THEN AS NEEDED FOR KNEE PAIN    . Inulin (FIBERCHOICE) 2 G CHEW Chew 1 tablet (2 g total) by mouth 2 (two) times daily.    Marland Kitchen losartan (COZAAR) 100 MG tablet Take 100 mg by mouth daily.       . mirtazapine (REMERON) 15 MG tablet Take 15 mg by mouth at bedtime. 1/2 tablet       . NAMENDA 10 MG tablet Take 10 mg by mouth daily.       Marland Kitchen OLANZapine (ZYPREXA) 5 MG tablet Take 2.5 mg by mouth at bedtime.       Marland Kitchen omeprazole (PRILOSEC) 20 MG capsule Take 20 mg by mouth daily.       . polyethylene glycol (MIRALAX / GLYCOLAX) packet Take 17 g by mouth daily.      . potassium chloride SA (K-DUR,KLOR-CON) 20 MEQ tablet 20 mEq daily. 2 tablets      . simvastatin (ZOCOR) 20 MG tablet Take 20 mg by mouth at bedtime.       . traMADol (ULTRAM) 50 MG tablet Take 50 mg by mouth every 6 (six) hours as needed.           Review of Systems      Objective:   Physical Exam  Vitals reviewed. Constitutional: She appears well-nourished. No distress.  HENT:  Head: Normocephalic and atraumatic.  Mouth/Throat: No oropharyngeal exudate.  Eyes: Pupils are equal, round, and reactive to light. No scleral icterus.  Neck: Normal range of motion. Neck supple.  Cardiovascular: Normal rate, regular rhythm and normal heart sounds.   Pulmonary/Chest: Effort normal and breath sounds normal. No respiratory distress.  Abdominal: Soft. Bowel sounds are normal. She exhibits no distension. There is no tenderness.  EXAM LIMITED-PT IN CHAIR   Musculoskeletal: She exhibits no edema.  Lymphadenopathy:    She has no cervical adenopathy.  Neurological: She is alert.  NO FOCAL DEFICITS   Psychiatric: She has a normal mood and affect.          Assessment & Plan:

## 2013-08-03 NOTE — Assessment & Plan Note (Signed)
Sx controlled.  CONTINUE MIRALAX AND FIBER.  FOLLOW UP IN ONE YEAR.

## 2013-12-28 ENCOUNTER — Inpatient Hospital Stay (HOSPITAL_COMMUNITY)
Admission: EM | Admit: 2013-12-28 | Discharge: 2014-01-02 | DRG: 554 | Disposition: A | Discharge status: Skilled Nursing Facility | Payer: Medicare Other | Attending: Internal Medicine | Admitting: Internal Medicine

## 2013-12-28 ENCOUNTER — Emergency Department (HOSPITAL_COMMUNITY): Payer: Medicare Other

## 2013-12-28 ENCOUNTER — Encounter (HOSPITAL_COMMUNITY): Payer: Self-pay | Admitting: Emergency Medicine

## 2013-12-28 DIAGNOSIS — D500 Iron deficiency anemia secondary to blood loss (chronic): Secondary | ICD-10-CM | POA: Diagnosis present

## 2013-12-28 DIAGNOSIS — Z87891 Personal history of nicotine dependence: Secondary | ICD-10-CM

## 2013-12-28 DIAGNOSIS — K449 Diaphragmatic hernia without obstruction or gangrene: Secondary | ICD-10-CM | POA: Diagnosis present

## 2013-12-28 DIAGNOSIS — F4310 Post-traumatic stress disorder, unspecified: Secondary | ICD-10-CM | POA: Diagnosis present

## 2013-12-28 DIAGNOSIS — Z66 Do not resuscitate: Secondary | ICD-10-CM | POA: Diagnosis present

## 2013-12-28 DIAGNOSIS — F319 Bipolar disorder, unspecified: Secondary | ICD-10-CM | POA: Diagnosis present

## 2013-12-28 DIAGNOSIS — M25569 Pain in unspecified knee: Secondary | ICD-10-CM

## 2013-12-28 DIAGNOSIS — N179 Acute kidney failure, unspecified: Secondary | ICD-10-CM | POA: Diagnosis present

## 2013-12-28 DIAGNOSIS — M25561 Pain in right knee: Secondary | ICD-10-CM

## 2013-12-28 DIAGNOSIS — M25562 Pain in left knee: Secondary | ICD-10-CM

## 2013-12-28 DIAGNOSIS — E860 Dehydration: Secondary | ICD-10-CM | POA: Diagnosis present

## 2013-12-28 DIAGNOSIS — Z79899 Other long term (current) drug therapy: Secondary | ICD-10-CM

## 2013-12-28 DIAGNOSIS — R7989 Other specified abnormal findings of blood chemistry: Secondary | ICD-10-CM | POA: Diagnosis present

## 2013-12-28 DIAGNOSIS — E7800 Pure hypercholesterolemia, unspecified: Secondary | ICD-10-CM | POA: Diagnosis present

## 2013-12-28 DIAGNOSIS — I10 Essential (primary) hypertension: Secondary | ICD-10-CM | POA: Diagnosis present

## 2013-12-28 DIAGNOSIS — N390 Urinary tract infection, site not specified: Secondary | ICD-10-CM | POA: Diagnosis present

## 2013-12-28 DIAGNOSIS — F0390 Unspecified dementia without behavioral disturbance: Secondary | ICD-10-CM | POA: Diagnosis present

## 2013-12-28 DIAGNOSIS — M109 Gout, unspecified: Principal | ICD-10-CM | POA: Diagnosis present

## 2013-12-28 DIAGNOSIS — K922 Gastrointestinal hemorrhage, unspecified: Secondary | ICD-10-CM | POA: Diagnosis present

## 2013-12-28 HISTORY — DX: Unspecified dementia, unspecified severity, without behavioral disturbance, psychotic disturbance, mood disturbance, and anxiety: F03.90

## 2013-12-28 HISTORY — DX: Anemia, unspecified: D64.9

## 2013-12-28 LAB — CBC WITH DIFFERENTIAL/PLATELET
BASOS PCT: 0 % (ref 0–1)
Basophils Absolute: 0 10*3/uL (ref 0.0–0.1)
EOS ABS: 0.1 10*3/uL (ref 0.0–0.7)
Eosinophils Relative: 1 % (ref 0–5)
HEMATOCRIT: 24.5 % — AB (ref 36.0–46.0)
HEMOGLOBIN: 7.3 g/dL — AB (ref 12.0–15.0)
LYMPHS ABS: 0.9 10*3/uL (ref 0.7–4.0)
Lymphocytes Relative: 12 % (ref 12–46)
MCH: 26 pg (ref 26.0–34.0)
MCHC: 29.8 g/dL — AB (ref 30.0–36.0)
MCV: 87.2 fL (ref 78.0–100.0)
MONO ABS: 0.4 10*3/uL (ref 0.1–1.0)
MONOS PCT: 6 % (ref 3–12)
NEUTROS PCT: 81 % — AB (ref 43–77)
Neutro Abs: 6.2 10*3/uL (ref 1.7–7.7)
Platelets: 298 10*3/uL (ref 150–400)
RBC: 2.81 MIL/uL — AB (ref 3.87–5.11)
RDW: 16.3 % — ABNORMAL HIGH (ref 11.5–15.5)
WBC: 7.6 10*3/uL (ref 4.0–10.5)

## 2013-12-28 LAB — PROTIME-INR
INR: 1.19 (ref 0.00–1.49)
Prothrombin Time: 14.8 seconds (ref 11.6–15.2)

## 2013-12-28 LAB — ABO/RH: ABO/RH(D): O POS

## 2013-12-28 LAB — URIC ACID: URIC ACID, SERUM: 11.7 mg/dL — AB (ref 2.4–7.0)

## 2013-12-28 LAB — D-DIMER, QUANTITATIVE: D-Dimer, Quant: 1.81 ug/mL-FEU — ABNORMAL HIGH (ref 0.00–0.48)

## 2013-12-28 LAB — PREPARE RBC (CROSSMATCH)

## 2013-12-28 MED ORDER — ACETAMINOPHEN 325 MG PO TABS
650.0000 mg | ORAL_TABLET | Freq: Four times a day (QID) | ORAL | Status: DC | PRN
  Administered 2014-01-01: 650 mg via ORAL
  Filled 2013-12-28: qty 2

## 2013-12-28 MED ORDER — RIVASTIGMINE 4.6 MG/24HR TD PT24
4.6000 mg | MEDICATED_PATCH | Freq: Every day | TRANSDERMAL | Status: DC
  Administered 2013-12-29 – 2014-01-02 (×5): 4.6 mg via TRANSDERMAL
  Filled 2013-12-28 (×6): qty 1

## 2013-12-28 MED ORDER — MIRTAZAPINE 15 MG PO TABS
7.5000 mg | ORAL_TABLET | Freq: Every day | ORAL | Status: DC
  Administered 2013-12-29 – 2014-01-01 (×5): 7.5 mg via ORAL
  Filled 2013-12-28 (×4): qty 0.5
  Filled 2013-12-28: qty 1
  Filled 2013-12-28: qty 0.5

## 2013-12-28 MED ORDER — ONDANSETRON HCL 4 MG PO TABS: 4.0000 mg | ORAL_TABLET | Freq: Four times a day (QID) | ORAL | Status: DC | PRN

## 2013-12-28 MED ORDER — MIRTAZAPINE 15 MG PO TBDP
ORAL_TABLET | ORAL | Status: AC
  Filled 2013-12-28: qty 1

## 2013-12-28 MED ORDER — LOSARTAN POTASSIUM 50 MG PO TABS
100.0000 mg | ORAL_TABLET | Freq: Every day | ORAL | Status: DC
  Administered 2013-12-29 – 2014-01-02 (×5): 100 mg via ORAL
  Filled 2013-12-28 (×5): qty 2

## 2013-12-28 MED ORDER — SODIUM CHLORIDE 0.9 % IV SOLN: 250.0000 mL | INTRAVENOUS | Status: DC | PRN

## 2013-12-28 MED ORDER — POLYETHYLENE GLYCOL 3350 17 G PO PACK
17.0000 g | PACK | Freq: Every day | ORAL | Status: DC
  Administered 2013-12-29 – 2014-01-01 (×4): 17 g via ORAL
  Filled 2013-12-28 (×5): qty 1

## 2013-12-28 MED ORDER — SIMVASTATIN 20 MG PO TABS
20.0000 mg | ORAL_TABLET | Freq: Every day | ORAL | Status: DC
  Administered 2013-12-29 – 2014-01-01 (×5): 20 mg via ORAL
  Filled 2013-12-28 (×5): qty 1

## 2013-12-28 MED ORDER — MEMANTINE HCL 10 MG PO TABS
10.0000 mg | ORAL_TABLET | Freq: Every day | ORAL | Status: DC
  Administered 2013-12-29 – 2014-01-02 (×5): 10 mg via ORAL
  Filled 2013-12-28 (×5): qty 1

## 2013-12-28 MED ORDER — SODIUM CHLORIDE 0.9 % IJ SOLN
3.0000 mL | Freq: Two times a day (BID) | INTRAMUSCULAR | Status: DC
  Administered 2013-12-29 – 2014-01-01 (×8): 3 mL via INTRAVENOUS

## 2013-12-28 MED ORDER — ONDANSETRON HCL 4 MG/2ML IJ SOLN: 4.0000 mg | Freq: Four times a day (QID) | INTRAMUSCULAR | Status: DC | PRN

## 2013-12-28 MED ORDER — COLCHICINE 0.6 MG PO TABS
0.6000 mg | ORAL_TABLET | Freq: Two times a day (BID) | ORAL | Status: AC
  Administered 2013-12-29 – 2013-12-31 (×6): 0.6 mg via ORAL
  Filled 2013-12-28 (×6): qty 1

## 2013-12-28 MED ORDER — DIVALPROEX SODIUM 125 MG PO CPSP
250.0000 mg | ORAL_CAPSULE | Freq: Two times a day (BID) | ORAL | Status: DC
  Administered 2013-12-29 – 2014-01-02 (×10): 250 mg via ORAL
  Filled 2013-12-28 (×13): qty 2

## 2013-12-28 MED ORDER — SODIUM CHLORIDE 0.9 % IJ SOLN: 3.0000 mL | INTRAMUSCULAR | Status: DC | PRN

## 2013-12-28 NOTE — ED Notes (Signed)
Pt repositioned in bed. Warm blankets applied. US being done at bedsside

## 2013-12-28 NOTE — ED Notes (Signed)
Pt from [facility], per group home pt's rt foot swelling and painful. Pt poor historian.

## 2013-12-28 NOTE — ED Provider Notes (Signed)
CSN: [id number]     Arrival date & time 12/28/13  1504 History   First MD Initiated Contact with Patient 12/28/13 1512     Chief Complaint  Patient presents with  . Foot Pain    right      HPI   Pt from [facility].  EMS called 2/2 "right foot swelling".  Also, apparently, pts PCP "has been trying to get her into a NH".  Pt in wheelchair at arrival of EMS to Home.  Reportedy, pt unable to ambulate on RLE today.  Minimally/Non-verbal.  History of Bipolar, PTSD, Htn, Constipation, Dimentia, Hypercholesterol. No injury per [facility] report.   Past Medical History  Diagnosis Date  . Bipolar 1 disorder   . PTSD (post-traumatic stress disorder)   . HTN (hypertension)    Past Surgical History  Procedure Laterality Date  . Colonoscopy  OCT 2010    [place] TICS, SML IH  . Upper gastrointestinal endoscopy  APR 2011    NSAID GASTRITIS, INFLAMMATORY POLYP  . Total abdominal hysterectomy     Family History  Problem Relation Age of Onset  . Colon cancer Neg Hx   . Colon polyps Neg Hx    History  Substance Use Topics  . Smoking status: Former Smoker -- 0.50 packs/day    Types: Cigarettes  . Smokeless tobacco: Not on file     Comment: years ago  . Alcohol Use: No   OB History   Grav Para Term Preterm Abortions TAB SAB Ect Mult Living                 Review of Systems  Unable to perform ROS: Patient nonverbal      Allergies  Review of patient's allergies indicates no known allergies.  Home Medications   Current Outpatient Rx  Name  Route  Sig  Dispense  Refill  . divalproex (DEPAKOTE SPRINKLE) 125 MG capsule   Oral   Take 125 mg by mouth 2 (two) times daily.          . EXELON 4.6 MG/24HR   Transdermal   Place 1 patch onto the skin daily.          . furosemide (LASIX) 40 MG tablet   Oral   Take 40 mg by mouth 2 (two) times daily.          Marland Kitchen ibuprofen (ADVIL,MOTRIN) 400 MG tablet      1 PO BID FOR 10 DAYS WITH FOOD OR MILK THEN AS NEEDED FOR KNEE  PAIN   30 tablet   5   . Inulin (FIBERCHOICE) 2 G CHEW   Oral   Chew 1 tablet (2 g total) by mouth 2 (two) times daily.   60 tablet   11     Please phone to [facility].  Thanks   . losartan (COZAAR) 100 MG tablet   Oral   Take 100 mg by mouth daily.          . mirtazapine (REMERON) 15 MG tablet   Oral   Take 15 mg by mouth at bedtime. 1/2 tablet          . NAMENDA 10 MG tablet   Oral   Take 10 mg by mouth daily.          Marland Kitchen OLANZapine (ZYPREXA) 5 MG tablet   Oral   Take 2.5 mg by mouth at bedtime.          Marland Kitchen omeprazole (PRILOSEC) 20 MG capsule  Oral   Take 20 mg by mouth daily.          . polyethylene glycol (MIRALAX / GLYCOLAX) packet   Oral   Take 17 g by mouth daily.         . potassium chloride SA (K-DUR,KLOR-CON) 20 MEQ tablet      20 mEq daily. 2 tablets         . simvastatin (ZOCOR) 20 MG tablet   Oral   Take 20 mg by mouth at bedtime.          . traMADol (ULTRAM) 50 MG tablet   Oral   Take 50 mg by mouth every 6 (six) hours as needed.           BP 110/52  Pulse 95  Temp(Src) 98.4 F (36.9 C) (Oral)  Resp 18  SpO2 97% Physical Exam  Constitutional:  Pt left side lying. Hips and knees flexed.    HENT:  No trauma.  Eyes open.  Eyes:  Conjunctiva pale   Neck: No JVD present. Carotid bruit is not present. No thyromegaly present.  Cardiovascular: Normal rate, regular rhythm and normal heart sounds.   Pulmonary/Chest: She has no decreased breath sounds. She has no wheezes. She has no rhonchi. She has no rales.  Abdominal: Soft. Normal appearance. There is no tenderness.  Musculoskeletal:       Legs: Neurological:  Minimally verbal..Moves all four in response to being moved. No Obvious CN deficit/Droop.    ED Course  Procedures (including critical care time) Labs Review Labs Reviewed  CBC WITH DIFFERENTIAL  D-DIMER, QUANTITATIVE  URIC ACID   Imaging Review No results found.   EKG Interpretation None      MDM    Final diagnoses:  None    17:35:  Pt able to speak (some).  States that she knows that she is in the hospital.  Complains of pain at knee greater than at foot.  X-Rays and Lab suggest Gout of foot, and knee.  US guided arthrocentesis by myself delivers 25cc of thin tan fluid from Right Knee.  Rectal is brown, guaiac + stool.    Rolland PorterMark Tove Wideman, MD 12/28/13 [phone]

## 2013-12-28 NOTE — H&P (Signed)
PCP:   FANTA,TESFAYE, MD   Chief Complaint:  Lethargy  HPI: 80 year old female who   has a past medical history of Bipolar 1 disorder; PTSD (post-traumatic stress disorder); and HTN (hypertension). was brought to the ED after patient has been lethargic at group home along with right foot swelling. The family has been trying to get her to skilled nursing facility because of increased skilled needs. Patient has history of dementia and is total dependent for ADLs though she is able to walk with a walker and as per niece she has not been able to walk over the past few days. Patient is alert but poor historian, she is oriented to self only. Patient has a history of bipolar disorder, PTSD. In the ED patient was found to have elevated d-dimer, ultrasound of the right lower extremity was done which was negative for DVT, she also had a right knee effusion which was aspirated and results show more inflammatory changes as she has 3905 WBCs in the synovial fluid. She has elevated uric acid of 11.4. The patient denies any leg pain at this time There is no history of chest pain no shortness of breath no nausea vomiting, she has a history of constipation. Patient also was found to have black positive stools with anemia hemoglobin 7.3.   Allergies:  No Known Allergies    Past Medical History  Diagnosis Date  . Bipolar 1 disorder   . PTSD (post-traumatic stress disorder)   . HTN (hypertension)     Past Surgical History  Procedure Laterality Date  . Colonoscopy  OCT 2010    [place] TICS, SML IH  . Upper gastrointestinal endoscopy  APR 2011    NSAID GASTRITIS, INFLAMMATORY POLYP  . Total abdominal hysterectomy      Prior to Admission medications   Medication Sig Start Date End Date Taking? Authorizing Provider  acetaminophen (TYLENOL) 325 MG tablet Take 650 mg by mouth every 6 (six) hours as needed. pain   Yes Historical Provider, MD  divalproex (DEPAKOTE SPRINKLE) 125 MG capsule Take 250 mg by mouth 2  (two) times daily.  06/23/11  Yes Historical Provider, MD  EXELON 4.6 MG/24HR Place 1 patch onto the skin daily.  06/22/11  Yes Historical Provider, MD  furosemide (LASIX) 40 MG tablet Take 40 mg by mouth 2 (two) times daily.  06/23/11  Yes Historical Provider, MD  ibuprofen (ADVIL,MOTRIN) 400 MG tablet Take 400 mg by mouth 2 (two) times daily as needed. Knee pain   Yes Historical Provider, MD  Inulin (FIBERCHOICE) 2 G CHEW Chew 1 tablet (2 g total) by mouth 2 (two) times daily. 02/17/11  Yes Joselyn ArrowKandice L Jones, NP  losartan (COZAAR) 100 MG tablet Take 100 mg by mouth daily.  06/23/11  Yes Historical Provider, MD  mirtazapine (REMERON) 15 MG tablet Take 7.5 mg by mouth at bedtime.  06/23/11  Yes Historical Provider, MD  NAMENDA 10 MG tablet Take 10 mg by mouth daily.  06/23/11  Yes Historical Provider, MD  omeprazole (PRILOSEC) 20 MG capsule Take 20 mg by mouth daily.  06/23/11  Yes Historical Provider, MD  polyethylene glycol (MIRALAX / GLYCOLAX) packet Take 17 g by mouth daily.   Yes Historical Provider, MD  potassium chloride SA (K-DUR,KLOR-CON) 20 MEQ tablet Take 40 mEq by mouth daily.  06/23/11  Yes Historical Provider, MD  simvastatin (ZOCOR) 20 MG tablet Take 20 mg by mouth at bedtime.  06/23/11  Yes Historical Provider, MD  traMADol (ULTRAM) 50 MG tablet Take  50 mg by mouth every 6 (six) hours as needed. pain 05/22/11   Historical Provider, MD    Social History:  reports that she has quit smoking. Her smoking use included Cigarettes. She smoked 0.50 packs per day. She does not have any smokeless tobacco history on file. She reports that she does not drink alcohol or use illicit drugs.  Family History  Problem Relation Age of Onset  . Colon cancer Neg Hx   . Colon polyps Neg Hx        Review of systems As in the history of present illness, rest of review of systems is unobtainable at this time   Physical Exam: Blood pressure 99/41, pulse 115, temperature 98.4 F (36.9 C), temperature source  Oral, resp. rate 18, SpO2 98.00%. Constitutional:   Patient is a well-developed and well-nourished demented female* in no acute distress and cooperative with exam. Head: Normocephalic and atraumatic Mouth: Mucus membranes moist Eyes: PERRL, EOMI, conjunctivae normal Neck: Supple, No Thyromegaly Cardiovascular: RRR, S1 normal, S2 normal Pulmonary/Chest: CTAB, no wheezes, rales, or rhonchi Abdominal: Soft. Non-tender, non-distended, bowel sounds are normal, no masses, organomegaly, or guarding present.  Neurological: Alert, oriented to self, moving all extremities.  Extremities : Right knee swelling, no erythema, nontender to palpation.   Labs on Admission:  Results for orders placed during the hospital encounter of 12/28/13 (from the past 48 hour(s))  CBC WITH DIFFERENTIAL     Status: Abnormal   Collection Time    12/28/13  3:45 PM      Result Value Ref Range   WBC 7.6  4.0 - 10.5 K/uL   RBC 2.81 (*) 3.87 - 5.11 MIL/uL   Hemoglobin 7.3 (*) 12.0 - 15.0 g/dL   HCT 16.1 (*) 09.6 - 04.5 %   MCV 87.2  78.0 - 100.0 fL   MCH 26.0  26.0 - 34.0 pg   MCHC 29.8 (*) 30.0 - 36.0 g/dL   RDW 40.9 (*) 81.1 - 91.4 %   Platelets 298  150 - 400 K/uL   Neutrophils Relative % 81 (*) 43 - 77 %   Neutro Abs 6.2  1.7 - 7.7 K/uL   Lymphocytes Relative 12  12 - 46 %   Lymphs Abs 0.9  0.7 - 4.0 K/uL   Monocytes Relative 6  3 - 12 %   Monocytes Absolute 0.4  0.1 - 1.0 K/uL   Eosinophils Relative 1  0 - 5 %   Eosinophils Absolute 0.1  0.0 - 0.7 K/uL   Basophils Relative 0  0 - 1 %   Basophils Absolute 0.0  0.0 - 0.1 K/uL  D-DIMER, QUANTITATIVE     Status: Abnormal   Collection Time    12/28/13  3:45 PM      Result Value Ref Range   D-Dimer, Quant 1.81 (*) 0.00 - 0.48 ug/mL-FEU   Comment:            AT THE INHOUSE ESTABLISHED CUTOFF     VALUE OF 0.48 ug/mL FEU,     THIS ASSAY HAS BEEN DOCUMENTED     IN THE LITERATURE TO HAVE     A SENSITIVITY AND NEGATIVE     PREDICTIVE VALUE OF AT LEAST     98 TO  99%.  THE TEST RESULT     SHOULD BE CORRELATED WITH     AN ASSESSMENT OF THE CLINICAL     PROBABILITY OF DVT / VTE.  URIC ACID     Status: Abnormal  Collection Time    12/28/13  3:45 PM      Result Value Ref Range   Uric Acid, Serum 11.7 (*) 2.4 - 7.0 mg/dL  BODY FLUID CULTURE     Status: None   Collection Time    12/28/13  5:25 PM      Result Value Ref Range   Specimen Description FLUID RIGHT KNEE     Special Requests NONE     Gram Stain       Value: MODERATE WBC PRESENT, PREDOMINANTLY PMN NO ORGANISMS SEEN     Performed at [facility]   Culture PENDING     Report Status PENDING    SYNOVIAL CELL COUNT + DIFF, W/ CRYSTALS     Status: Abnormal (Preliminary result)   Collection Time    12/28/13  5:25 PM      Result Value Ref Range   Color, Synovial TAN (*) YELLOW   Appearance-Synovial CLOUDY (*) CLEAR   Crystals, Fluid PENDING     WBC, Synovial 3905 (*) 0 - 200 /cu mm   Neutrophil, Synovial 95 (*) 0 - 25 %   Lymphocytes-Synovial Fld 3  0 - 20 %   Monocyte-Macrophage-Synovial Fluid 2 (*) 50 - 90 %   Eosinophils-Synovial 0  0 - 1 %   Other Cells-SYN 0    TYPE AND SCREEN     Status: None   Collection Time    12/28/13  5:46 PM      Result Value Ref Range   ABO/RH(D) O POS     Antibody Screen NEG     Sample Expiration 12/31/2013    PROTIME-INR     Status: None   Collection Time    12/28/13  5:46 PM      Result Value Ref Range   Prothrombin Time 14.8  11.6 - 15.2 seconds   INR 1.19  0.00 - 1.49    Radiological Exams on Admission: Dg Hip Complete Right  12/28/2013   CLINICAL DATA:  Diffuse right leg pain  EXAM: RIGHT HIP - COMPLETE 2+ VIEW  COMPARISON:  Concurrently obtained radiographs of the right knee and foot  FINDINGS: No acute fracture or malalignment. Mild bilateral hip degenerative changes. Atherosclerotic calcifications noted in the right common femoral artery. The bony pelvis appears intact. Incompletely imaged mild degenerative change in the lower lumbar  spine. Aortoiliac calcifications noted. No focal soft tissue abnormality. Query mild constipation with a moderate formed stool in the rectum.  IMPRESSION: No acute osseous abnormality.  Atherosclerotic vascular calcifications.  Mild bilateral hip osteoarthritis.   Electronically Signed   By: Malachy Moan M.D.   On: 12/28/2013 16:46   US Venous Img Lower Unilateral Right  12/28/2013   CLINICAL DATA:  Positive D-dimer and leg swelling.  EXAM: RIGHT LOWER EXTREMITY VENOUS DOPPLER ULTRASOUND  TECHNIQUE: Gray-scale sonography with graded compression, as well as color Doppler and duplex ultrasound, were performed to evaluate the deep venous system from the level of the common femoral vein through the popliteal and proximal calf veins. Spectral Doppler was utilized to evaluate flow at rest and with distal augmentation maneuvers.  COMPARISON:  None.  FINDINGS: Thrombus within deep veins:  None visualized.  Normal compressibility, color Doppler flow and augmentation in the right common femoral vein, right femoral vein and right popliteal vein. Visualized calf veins are patent. The visualized great saphenous vein is patent.  Other findings:  Soft tissue edema in the calf and ankle.  IMPRESSION: Negative for right lower extremity  DVT.   Electronically Signed   By: Richarda Overlie M.D.   On: 12/28/2013 18:18   Dg Knee Complete 4 Views Left  12/28/2013   CLINICAL DATA:  Left lower extremity pain  EXAM: LEFT KNEE - COMPLETE 4+ VIEW  COMPARISON:  Prior radiographs of the left kidney 11/21/2010  FINDINGS: No acute fracture or malalignment. Advanced tricompartmental osteoarthritis most severe in the lateral compartment. There is a moderate suprapatellar knee joint effusion. Atherosclerotic calcifications noted in the popliteal artery. The bones appear osteopenic. Additional atherosclerotic calcifications are noted in the region of the runoff vessels.  IMPRESSION: 1. Moderate suprapatellar knee joint effusion is favored to be  degenerative in etiology. However, infection is difficult to exclude radiographically. 2. Tricompartmental degenerative osteoarthritis most significant in the lateral compartment. There is been modest interval progression compared to February of 2012. 3. Atherosclerotic vascular calcifications.   Electronically Signed   By: Malachy Moan M.D.   On: 12/28/2013 16:44   Dg Foot 2 Views Right  12/28/2013   CLINICAL DATA:  Diffuse right lower extremity pain  EXAM: RIGHT FOOT - 2 VIEW  COMPARISON:  Concurrently obtained radiographs of the knee and hip  FINDINGS: Hallux valgus angulation of the great toe. There is diffuse soft tissue swelling overlying the great toe MTP joint. Irregularity and rarefaction of the medial aspect of the head of the first metatarsal and base of the proximal phalanx of the great toe. In general, the bones appear osteopenic. No evidence acute fracture or malalignment. No other degenerative or destructive changes. Mild midfoot osteoarthritis. No ankle joint effusion.  IMPRESSION: Mild bony destructive changes in the medial aspect of the great toe MTP joint with associated overlying soft tissue swelling. Differential considerations include gout, and potentially osteomyelitis.   Electronically Signed   By: Malachy Moan M.D.   On: 12/28/2013 16:48    Assessment/Plan Principal Problem:   Gout Active Problems:   GIB (gastrointestinal bleeding)   Dementia  Gout Patient likely has inflammatory cause for the right knee swelling, will start her on colchicine 0.6 mg by mouth twice a day She has elevated uric acid level Will hold the Lasix as Lasix can exacerbate gout. Will start acetaminophen when necessary for pain.  Anemia Likely due to GI bleed, she has guaiac positive stools. Discussed with patient's niece in detail was also the healthcare power of attorney, she does not want patient to undergo endoscopy at this time. Will transfuse 1 unit of PRBC as she has symptomatic  anemia.  GI bleed As above, family does not want any intervention at this time and would like to manage GI bleed conservatively.  Right leg swelling Ultrasound of the lower extremity was negative for DVT  Dementia Continue Namenda, Exelon patch. Continue Remeron.  DVT prophylaxis SCDs  Code status: patient is DO NOT RESUSCITATE  Family discussion: discussed with niece at bedside   Time Spent on Admission: 65 min  [facility] Pager: [phone] 12/28/2013, 8:43 PM  If 7PM-7AM, please contact night-coverage  [email or web address]  Password [email or web address]

## 2013-12-28 NOTE — ED Notes (Signed)
EDP in to aspirate fluid from right knee

## 2013-12-28 NOTE — ED Notes (Signed)
Pt here from [facility] for evaluation of pain in right foot. Pt does not verbalize any pain. Right foot is swollen. Pt does have old bruising on both legs. Also, staff called and stated Dr. Felecia ShellingFanta was [facility] because she is not eating or drinking. Pt lying in a fetal position. No verbal response or grimacing when feet or legs touched. Per EMS, pt was sitting in a wheelchair when they arrived to pick her up

## 2013-12-29 ENCOUNTER — Encounter (HOSPITAL_COMMUNITY): Payer: Self-pay | Admitting: Gastroenterology

## 2013-12-29 ENCOUNTER — Other Ambulatory Visit: Payer: Self-pay | Admitting: Gastroenterology

## 2013-12-29 ENCOUNTER — Encounter (HOSPITAL_COMMUNITY)
Admission: EM | Disposition: A | Discharge status: Skilled Nursing Facility | Payer: Self-pay | Source: Home / Self Care | Attending: Internal Medicine

## 2013-12-29 ENCOUNTER — Inpatient Hospital Stay (HOSPITAL_COMMUNITY): Payer: Medicare Other

## 2013-12-29 DIAGNOSIS — K2990 Gastroduodenitis, unspecified, without bleeding: Secondary | ICD-10-CM

## 2013-12-29 DIAGNOSIS — K2970 Gastritis, unspecified, without bleeding: Secondary | ICD-10-CM

## 2013-12-29 DIAGNOSIS — D649 Anemia, unspecified: Secondary | ICD-10-CM

## 2013-12-29 DIAGNOSIS — K449 Diaphragmatic hernia without obstruction or gangrene: Secondary | ICD-10-CM

## 2013-12-29 DIAGNOSIS — R195 Other fecal abnormalities: Secondary | ICD-10-CM

## 2013-12-29 HISTORY — PX: ESOPHAGOGASTRODUODENOSCOPY: SHX5428

## 2013-12-29 LAB — CBC
HCT: 24.5 % — ABNORMAL LOW (ref 36.0–46.0)
Hemoglobin: 7.9 g/dL — ABNORMAL LOW (ref 12.0–15.0)
MCH: 28.1 pg (ref 26.0–34.0)
MCHC: 32.2 g/dL (ref 30.0–36.0)
MCV: 87.2 fL (ref 78.0–100.0)
PLATELETS: 235 10*3/uL (ref 150–400)
RBC: 2.81 MIL/uL — ABNORMAL LOW (ref 3.87–5.11)
RDW: 15.5 % (ref 11.5–15.5)
WBC: 6.3 10*3/uL (ref 4.0–10.5)

## 2013-12-29 LAB — URINE MICROSCOPIC-ADD ON

## 2013-12-29 LAB — COMPREHENSIVE METABOLIC PANEL
ALBUMIN: 2.2 g/dL — AB (ref 3.5–5.2)
ALT: 8 U/L (ref 0–35)
AST: 8 U/L (ref 0–37)
Alkaline Phosphatase: 153 U/L — ABNORMAL HIGH (ref 39–117)
BILIRUBIN TOTAL: 0.4 mg/dL (ref 0.3–1.2)
BUN: 55 mg/dL — ABNORMAL HIGH (ref 6–23)
CHLORIDE: 107 meq/L (ref 96–112)
CO2: 20 meq/L (ref 19–32)
Calcium: 9 mg/dL (ref 8.4–10.5)
Creatinine, Ser: 1.91 mg/dL — ABNORMAL HIGH (ref 0.50–1.10)
GFR calc Af Amer: 27 mL/min — ABNORMAL LOW (ref >90)
GFR, EST NON AFRICAN AMERICAN: 24 mL/min — AB (ref >90)
Glucose, Bld: 127 mg/dL — ABNORMAL HIGH (ref 70–99)
Potassium: 4.9 mEq/L (ref 3.7–5.3)
SODIUM: 141 meq/L (ref 137–147)
Total Protein: 7 g/dL (ref 6.0–8.3)

## 2013-12-29 LAB — URINALYSIS, ROUTINE W REFLEX MICROSCOPIC
Bilirubin Urine: NEGATIVE
Glucose, UA: NEGATIVE mg/dL
Ketones, ur: NEGATIVE mg/dL
Nitrite: NEGATIVE
Protein, ur: NEGATIVE mg/dL
SPECIFIC GRAVITY, URINE: 1.01 (ref 1.005–1.030)
UROBILINOGEN UA: 0.2 mg/dL (ref 0.0–1.0)
pH: 6 (ref 5.0–8.0)

## 2013-12-29 LAB — SYNOVIAL CELL COUNT + DIFF, W/ CRYSTALS
Crystals, Fluid: NONE SEEN
Eosinophils-Synovial: 0 % (ref 0–1)
LYMPHOCYTES-SYNOVIAL FLD: 3 % (ref 0–20)
MONOCYTE-MACROPHAGE-SYNOVIAL FLUID: 2 % — AB (ref 50–90)
Neutrophil, Synovial: 95 % — ABNORMAL HIGH (ref 0–25)
OTHER CELLS-SYN: 0
WBC, Synovial: 3905 /mm3 — ABNORMAL HIGH (ref 0–200)

## 2013-12-29 SURGERY — EGD (ESOPHAGOGASTRODUODENOSCOPY)
Anesthesia: Moderate Sedation

## 2013-12-29 MED ORDER — STERILE WATER FOR IRRIGATION IR SOLN
Status: DC | PRN
  Administered 2013-12-29: 13:00:00

## 2013-12-29 MED ORDER — LORAZEPAM 2 MG/ML IJ SOLN
0.5000 mg | Freq: Once | INTRAMUSCULAR | Status: AC
  Administered 2013-12-29: 0.5 mg via INTRAVENOUS
  Filled 2013-12-29: qty 1

## 2013-12-29 MED ORDER — PANTOPRAZOLE SODIUM 40 MG IV SOLR
40.0000 mg | INTRAVENOUS | Status: DC
  Administered 2013-12-29: 40 mg via INTRAVENOUS
  Filled 2013-12-29: qty 40

## 2013-12-29 MED ORDER — PANTOPRAZOLE SODIUM 40 MG PO TBEC
40.0000 mg | DELAYED_RELEASE_TABLET | Freq: Every day | ORAL | Status: DC
  Administered 2013-12-30 – 2014-01-02 (×4): 40 mg via ORAL
  Filled 2013-12-29 (×4): qty 1

## 2013-12-29 MED ORDER — SODIUM CHLORIDE 0.9 % IV SOLN: 250.0000 mL | INTRAVENOUS | Status: DC | PRN

## 2013-12-29 MED ORDER — SODIUM CHLORIDE 0.9 % IV SOLN
INTRAVENOUS | Status: DC
  Administered 2013-12-29: 12:00:00 via INTRAVENOUS

## 2013-12-29 MED ORDER — MEPERIDINE HCL 100 MG/ML IJ SOLN
INTRAMUSCULAR | Status: DC | PRN
  Administered 2013-12-29: 25 mg via INTRAVENOUS

## 2013-12-29 MED ORDER — MIDAZOLAM HCL 5 MG/5ML IJ SOLN
INTRAMUSCULAR | Status: AC
  Filled 2013-12-29: qty 10

## 2013-12-29 MED ORDER — MIDAZOLAM HCL 5 MG/5ML IJ SOLN
INTRAMUSCULAR | Status: DC | PRN
  Administered 2013-12-29: 2 mg via INTRAVENOUS

## 2013-12-29 MED ORDER — MEPERIDINE HCL 100 MG/ML IJ SOLN
INTRAMUSCULAR | Status: AC
  Filled 2013-12-29: qty 2

## 2013-12-29 MED ORDER — LIDOCAINE VISCOUS 2 % MT SOLN
OROMUCOSAL | Status: AC
  Filled 2013-12-29: qty 15

## 2013-12-29 NOTE — Progress Notes (Signed)
Discussed with Dr. Darrick PennaFields. Urine specimen contaminated. Will order in/out cath for urine collection. Head CT for mental status changes.

## 2013-12-29 NOTE — Consult Note (Signed)
REVIEWED.  

## 2013-12-29 NOTE — Consult Note (Signed)
Referring Provider: Avon Gullyesfaye Fanta, Valenzuela Primary Care Physician:  Carly GullyFANTA,TESFAYE, Valenzuela Primary Gastroenterologist:  Carly EvaSandi Fields, Valenzuela  Reason for Consultation:  Heme positive stool, anemia  HPI: Carly Valenzuela is a 80 y.o. female with past medical history of bipolar disorder, posttraumatic stress disorder, hypertension who is brought to the emergency department from [facility] where she was found to be more lethargic and had right foot swelling. Reportedly the family had been trying to get her to a skilled nursing facility because of increased skilled needs. History of dementia. Patient poor historian, oriented to self only.  The emergency department she was found to have an elevated d-dimer. She had Doppler study the right lower extremity which was negative for DVT. She had aspiration of right knee effusion. She had not been able to ambulate for 2 days.  She was noted to have a hemoglobin of 7.3, hematocrit 24.5. MCV 87.2. Last available hemoglobin per medical record was 11.5 and 2011. Back in 2011 she had anemia profile consistent with anemia of chronic disease. She received one unit of packed red blood cells early this morning. It is not clear whether or not she has had post transfusion lab. There's been no signs of overt GI bleeding.  Patient's last colonoscopy was in October 2010, she had small internal hemorrhoids, sigmoid diverticulosis. EGD in April 2011 showed NSAID gastritis, inflammatory polyp.  She is on low-dose Advil when necessary knee pain. Also on daily PPI.  Patient unable to provide me any significant history today. Denies abdominal pain or swallowing difficulties. States her bowels move "enough".  I spoke with patient's niece and power of attorney, Carly Valenzuela, is also registered nurse about the patient. Per the past several days she has noted a significant decline in her oral intake. Patient has intermittently complained of some abdominal pain although specific location  not clearly detailed. There has been no report of melena or rectal bleeding which she has specifically asked for nursing staff about. For the past several weeks she's had a decline in her ambulation felt to be related to her knee pain. She saw Carly Valenzuela with regards to nursing home placement due to her increased care needs. For the past week she's not been able to carry on conversations she has had in the past. Ms. Carly Valenzuela is concerned about urinary tract infection. Patient has no short-term memory in the setting of dementia.   Prior to Admission medications   Medication Sig Start Date End Date Taking? Authorizing Provider  acetaminophen (TYLENOL) 325 MG tablet Take 650 mg by mouth every 6 (six) hours as needed. pain   Yes Historical Provider, Valenzuela  divalproex (DEPAKOTE SPRINKLE) 125 MG capsule Take 250 mg by mouth 2 (two) times daily.  06/23/11  Yes Historical Provider, Valenzuela  EXELON 4.6 MG/24HR Place 1 patch onto the skin daily.  06/22/11  Yes Historical Provider, Valenzuela  furosemide (LASIX) 40 MG tablet Take 40 mg by mouth 2 (two) times daily.  06/23/11  Yes Historical Provider, Valenzuela  ibuprofen (ADVIL,MOTRIN) 400 MG tablet Take 400 mg by mouth 2 (two) times daily as needed. Knee pain   Yes Historical Provider, Valenzuela  Inulin (FIBERCHOICE) 2 G CHEW Chew 1 tablet (2 g total) by mouth 2 (two) times daily. 02/17/11  Yes Carly Valenzuela  losartan (COZAAR) 100 MG tablet Take 100 mg by mouth daily.  06/23/11  Yes Historical Provider, Valenzuela  mirtazapine (REMERON) 15 MG tablet Take 7.5 mg by mouth at bedtime.  06/23/11  Yes Historical Provider, Valenzuela  NAMENDA 10 MG tablet Take 10 mg by mouth daily.  06/23/11  Yes Historical Provider, Valenzuela  omeprazole (PRILOSEC) 20 MG capsule Take 20 mg by mouth daily.  06/23/11  Yes Historical Provider, Valenzuela  polyethylene glycol (MIRALAX / GLYCOLAX) packet Take 17 g by mouth daily.   Yes Historical Provider, Valenzuela  potassium chloride SA (K-DUR,KLOR-CON) 20 MEQ tablet Take 40 mEq by mouth daily.  06/23/11  Yes  Historical Provider, Valenzuela  simvastatin (ZOCOR) 20 MG tablet Take 20 mg by mouth at bedtime.  06/23/11  Yes Historical Provider, Valenzuela  traMADol (ULTRAM) 50 MG tablet Take 50 mg by mouth every 6 (six) hours as needed. pain 05/22/11   Historical Provider, Valenzuela    Current Facility-Administered Medications  Medication Dose Route Frequency Provider Last Rate Last Dose  . 0.9 %  sodium chloride infusion  250 mL Intravenous PRN Carly Valenzuela      . acetaminophen (TYLENOL) tablet 650 mg  650 mg Oral Q6H PRN Carly Valenzuela      . colchicine tablet 0.6 mg  0.6 mg Oral BID Carly Valenzuela   0.6 mg at 12/29/13 0006  . divalproex (DEPAKOTE SPRINKLE) capsule 250 mg  250 mg Oral BID Carly Valenzuela   250 mg at 12/29/13 0006  . losartan (COZAAR) tablet 100 mg  100 mg Oral Daily Carly Valenzuela      . memantine [facility]) tablet 10 mg  10 mg Oral Daily Carly Valenzuela      . mirtazapine (REMERON) tablet 7.5 mg  7.5 mg Oral QHS Carly Valenzuela   7.5 mg at 12/29/13 0007  . ondansetron (ZOFRAN) tablet 4 mg  4 mg Oral Q6H PRN Carly Valenzuela       Or  . ondansetron (ZOFRAN) injection 4 mg  4 mg Intravenous Q6H PRN Carly Valenzuela      . pantoprazole (PROTONIX) injection 40 mg  40 mg Intravenous Q24H Carly Valenzuela      . polyethylene glycol (MIRALAX / GLYCOLAX) packet 17 g  17 g Oral Daily Carly Valenzuela      . rivastigmine (EXELON) 4.6 mg/24hr 4.6 mg  4.6 mg Transdermal Daily Carly Valenzuela      . simvastatin (ZOCOR) tablet 20 mg  20 mg Oral QHS Carly Valenzuela   20 mg at 12/29/13 0006  . sodium chloride 0.9 % injection 3 mL  3 mL Intravenous Q12H Carly Valenzuela   3 mL at 12/29/13 0007  . sodium chloride 0.9 % injection 3 mL  3 mL Intravenous PRN Carly Valenzuela        Allergies as of 12/28/2013  . (No Known Allergies)    Past Medical History  Diagnosis Date  . Bipolar 1 disorder   . PTSD (post-traumatic stress disorder)   . HTN (hypertension)   . Dementia     Past Surgical History   Procedure Laterality Date  . Colonoscopy  OCT 2010    [place] TICS, SML IH  . Upper gastrointestinal endoscopy  APR 2011    NSAID GASTRITIS, INFLAMMATORY POLYP  . Total abdominal hysterectomy      Family History  Problem Relation Age of Onset  . Colon cancer Neg Hx   . Colon polyps Neg Hx     History   Social History  . Marital Status: Widowed  Spouse Name: N/A    Number of Children: N/A  . Years of Education: N/A   Occupational History  . Not on file.   Social History Main Topics  . Smoking status: Former Smoker -- 0.50 packs/day    Types: Cigarettes  . Smokeless tobacco: Not on file     Comment: years ago  . Alcohol Use: No  . Drug Use: No  . Sexual Activity: Not on file   Other Topics Concern  . Not on file   Social History Narrative  . No narrative on file     ROS: Unobtainable with accuracy due to patient's dementia.       Physical Examination: Vital signs in last 24 hours: Temp:  [96.7 F (35.9 C)-98.5 F (36.9 C)] 96.7 F (35.9 C) (03/20 0455) Pulse Rate:  [95-121] 113 (03/20 0455) Resp:  [16-18] 16 (03/20 0455) BP: (94-132)/(41-72) 122/72 mmHg (03/20 0455) SpO2:  [97 %-98 %] 97 % (03/20 0130) Weight:  [196 lb 12.8 oz (89.268 kg)] 196 lb 12.8 oz (89.268 kg) (03/19 2122) Last BM Date:  (pt unsure)  General: Elderly female, well-developed in no acute distress.  Head: Normocephalic, atraumatic.   Eyes: Conjunctiva pale, no icterus. Mouth: Oropharyngeal mucosa moist and pink , no lesions erythema or exudate. Neck: Supple without thyromegaly, masses, or lymphadenopathy.  Lungs: Clear to auscultation bilaterally.  Heart: Regular rate and rhythm, no murmurs rubs or gallops.  Abdomen: Bowel sounds are normal, nontender, nondistended, no hepatosplenomegaly or masses, no abdominal bruits or    hernia , no rebound or guarding.   Rectal: Hemoccult-positive in the ER Extremities: Right knee swelling   Neuro: Alert and oriented x 4 , grossly normal  neurologically.  Skin: Warm and dry, no rash or jaundice.   Psych: Alert and cooperative, normal mood and affect.        Intake/Output from previous day:   Intake/Output this shift:    Lab Results: CBC  Recent Labs  12/28/13 1545 12/29/13 0449  WBC 7.6 6.3  HGB 7.3* 7.9*  HCT 24.5* 24.5*  MCV 87.2 87.2  PLT 298 235   BMET  Recent Labs  12/29/13 0449  NA 141  K 4.9  CL 107  CO2 20  GLUCOSE 127*  BUN 55*  CREATININE 1.91*  CALCIUM 9.0   LFT  Recent Labs  12/29/13 0449  BILITOT 0.4  ALKPHOS 153*  AST 8  ALT 8  PROT 7.0  ALBUMIN 2.2*     PT/INR )  Recent Labs  12/28/13 1746  LABPROT 14.8  INR 1.19      Imaging Studies: Dg Hip Complete Right  12/28/2013   CLINICAL DATA:  Diffuse right leg pain  EXAM: RIGHT HIP - COMPLETE 2+ VIEW  COMPARISON:  Concurrently obtained radiographs of the right knee and foot  FINDINGS: No acute fracture or malalignment. Mild bilateral hip degenerative changes. Atherosclerotic calcifications noted in the right common femoral artery. The bony pelvis appears intact. Incompletely imaged mild degenerative change in the lower lumbar spine. Aortoiliac calcifications noted. No focal soft tissue abnormality. Query mild constipation with a moderate formed stool in the rectum.  IMPRESSION: No acute osseous abnormality.  Atherosclerotic vascular calcifications.  Mild bilateral hip osteoarthritis.   Electronically Signed   By: Malachy Moan M.D.   On: 12/28/2013 16:46   US Venous Img Lower Unilateral Right  12/28/2013   CLINICAL DATA:  Positive D-dimer and leg swelling.  EXAM: RIGHT LOWER EXTREMITY VENOUS DOPPLER ULTRASOUND  TECHNIQUE: Gray-scale sonography  with graded compression, as well as color Doppler and duplex ultrasound, were performed to evaluate the deep venous system from the level of the common femoral vein through the popliteal and proximal calf veins. Spectral Doppler was utilized to evaluate flow at rest and with distal  augmentation maneuvers.  COMPARISON:  None.  FINDINGS: Thrombus within deep veins:  None visualized.  Normal compressibility, color Doppler flow and augmentation in the right common femoral vein, right femoral vein and right popliteal vein. Visualized calf veins are patent. The visualized great saphenous vein is patent.  Other findings:  Soft tissue edema in the calf and ankle.  IMPRESSION: Negative for right lower extremity DVT.   Electronically Signed   By: Richarda Overlie M.D.   On: 12/28/2013 18:18   Dg Knee Complete 4 Views Left  12/28/2013   CLINICAL DATA:  Left lower extremity pain  EXAM: LEFT KNEE - COMPLETE 4+ VIEW  COMPARISON:  Prior radiographs of the left kidney 11/21/2010  FINDINGS: No acute fracture or malalignment. Advanced tricompartmental osteoarthritis most severe in the lateral compartment. There is a moderate suprapatellar knee joint effusion. Atherosclerotic calcifications noted in the popliteal artery. The bones appear osteopenic. Additional atherosclerotic calcifications are noted in the region of the runoff vessels.  IMPRESSION: 1. Moderate suprapatellar knee joint effusion is favored to be degenerative in etiology. However, infection is difficult to exclude radiographically. 2. Tricompartmental degenerative osteoarthritis most significant in the lateral compartment. There is been modest interval progression compared to February of 2012. 3. Atherosclerotic vascular calcifications.   Electronically Signed   By: Malachy Moan M.D.   On: 12/28/2013 16:44   Dg Foot 2 Views Right  12/28/2013   CLINICAL DATA:  Diffuse right lower extremity pain  EXAM: RIGHT FOOT - 2 VIEW  COMPARISON:  Concurrently obtained radiographs of the knee and hip  FINDINGS: Hallux valgus angulation of the great toe. There is diffuse soft tissue swelling overlying the great toe MTP joint. Irregularity and rarefaction of the medial aspect of the head of the first metatarsal and base of the proximal phalanx of the great  toe. In general, the bones appear osteopenic. No evidence acute fracture or malalignment. No other degenerative or destructive changes. Mild midfoot osteoarthritis. No ankle joint effusion.  IMPRESSION: Mild bony destructive changes in the medial aspect of the great toe MTP joint with associated overlying soft tissue swelling. Differential considerations include gout, and potentially osteomyelitis.   Electronically Signed   By: Malachy Moan M.D.   On: 12/28/2013 16:48  [4 week]   Impression: 80 year old lady who presented with concern of right lower extremity edema by the group home. There's been report of need to place in a skilled nursing facility due need for increased level of care. She was found to have normocytic anemia and Hemoccult-positive on exam. There has been no report of overt GI bleeding. Patient has had diminished oral intake over the past several days in the setting of right knee swelling/effusion. She is on chronic ibuprofen although somewhat low dose, in the setting of PPI. History of NSAID gastritis in the past.  I discussed at length with the patient's power of attorney. With likely drop in her baseline hemoglobin, Hemoccult-positive stool, change in appetite, reported abdominal pain in the setting of NSAID use, we would offer her an upper endoscopy for further evaluation. Ms. Carly Ishihara desires EGD for further evaluation.  Plan: 1. EGD today. 2. Continue PPI. 3. U/A with culture.  We would like to thank you for the  opportunity to participate in the care of Carly Valenzuela.    LOS: 1 day   Tana Coast  12/29/2013, 8:28 AM

## 2013-12-29 NOTE — Clinical Social Work Placement (Signed)
Clinical Social Work Department CLINICAL SOCIAL WORK PLACEMENT NOTE 12/29/2013  Patient:  Carly Valenzuela,[name]  Account Number:  [id number] Admit date:  12/28/2013  Clinical Social Worker:  Derenda FennelKARA Cree Kunert, LCSW  Date/time:  12/29/2013 04:01 PM  Clinical Social Work is seeking post-discharge placement for this patient at the following level of care:   SKILLED NURSING   (*CSW will update this form in Epic as items are completed)   12/29/2013  Patient/family provided with [facility] Clinical [facility] Work's list of facilities offering this level of care within the geographic area requested by the patient (or if unable, by the patient's family).  12/29/2013  Patient/family informed of their freedom to choose among providers that offer the needed level of care, that participate in Medicare, Medicaid or managed care program needed by the patient, have an available bed and are willing to accept the patient.  12/29/2013  Patient/family informed of [facility]' ownership interest in [facility], as well as of the fact that they are under no obligation to receive care at this facility.  PASARR submitted to EDS on 12/29/2013 PASARR number received from EDS on   FL2 transmitted to all facilities in geographic area requested by pt/family on  12/29/2013 FL2 transmitted to all facilities within larger geographic area on   Patient informed that his/her managed care company has contracts with or will negotiate with  certain facilities, including the following:     Patient/family informed of bed offers received:   Patient chooses bed at  Physician recommends and patient chooses bed at    Patient to be transferred to  on   Patient to be transferred to facility by   The following physician request were entered in Epic:   Additional Comments:  Derenda FennelKara Mckenley Birenbaum, LCSW [phone]

## 2013-12-29 NOTE — Evaluation (Signed)
Physical Therapy Evaluation Patient Details Name: Carly Valenzuela MRN: [id number] DOB: 12/01/1930 Today's Date: 12/29/2013 Time: 8657-8469 PT Time Calculation (min): 22 min  PT Assessment / Plan / Recommendation History of Present Illness  80 y.o. female with past medical history of bipolar disorder, posttraumatic stress disorder, hypertension who is brought to the emergency department from [facility] where she was found to be more lethargic and had right foot swelling. Reportedly the family had been trying to get her to a skilled nursing facility because of increased skilled needs. History of dementia. Patient poor historian, oriented to self only.  Clinical Impression  Patient is unable to perform any bed mobility without moderate to max assist. Patient is able to verbally respond to yes/no questions though she is unable to physically follow commands and unable to answer open ended questions. Strength testing was attempted but unable to accurately be completes due to poor patient cooperation. Patient attempted rolling in bed, but was unable to follow commands. Assuming medical status improved patient may be appropriate for a Skilled nursing facility to increase strength, mobility, activity tolerance, and gait. However if patient's status does no improve recommending long term care    PT Assessment  Patient needs continued PT services    Follow Up Recommendations  SNF       Barriers to Discharge Inaccessible home environment;Decreased caregiver support, Unable to follow commands      Equipment Recommendations  Standard walker       Frequency Min 4X/week    Precautions / Restrictions Precautions Precautions: Fall Restrictions Weight Bearing Restrictions: Yes (Patient on Bed rest)   Pertinent Vitals/Pain Rt knee pain, other wise unable to specify.       Mobility  Bed Mobility Overal bed mobility: Needs Assistance Bed Mobility: Rolling;Supine to Sit Rolling: Max  assist Supine to sit: Max assist General bed mobility comments: patient does not move in bed despite verbal, visua, tactile cuing. Patient display mild response to forced movement ntoing increased Rt knee pain.  Transfers General transfer comment: Unable to assess, due to patient being on bed rest. patient was unable to perform bed activities, patient is likely unable to perform transfers or gait.         PT Diagnosis: Generalized weakness;Altered mental status;Difficulty walking  PT Problem List: Decreased strength;Decreased range of motion;Decreased activity tolerance;Decreased balance;Decreased coordination;Decreased mobility PT Treatment Interventions: Gait training;Functional mobility training;Therapeutic activities;Therapeutic exercise;Patient/family education     PT Goals(Current goals can be found in the care plan section) Acute Rehab PT Goals Patient Stated Goal: Patient unable to give more than yes no respones, unable to set goal.  Potential to Achieve Goals: Poor  Visit Information  Last PT Received On: 12/29/13 Assistance Needed: +1 Reason Eval/Treat Not Completed: Fatigue/lethargy limiting ability to participate;Patient's level of consciousness History of Present Illness: 80 y.o. female with past medical history of bipolar disorder, posttraumatic stress disorder, hypertension who is brought to the emergency department from [facility] where she was found to be more lethargic and had right foot swelling. Reportedly the family had been trying to get her to a skilled nursing facility because of increased skilled needs. History of dementia. Patient poor historian, oriented to self only.       Prior Functioning  Home Living Family/patient expects to be discharged to:: Unsure Prior Function Level of Independence: Needs assistance Communication Communication: Other (comment);Expressive difficulties (Patient unable to follow commands. only gives yes/no responses)     Cognition  Cognition Behavior During Therapy: Flat  affect Overall Cognitive Status: Impaired/Different from baseline Area of Impairment: Orientation;Awareness Orientation Level: Disoriented to;Place;Time;Situation Memory: Decreased short-term memory General Comments: Patient displays an inability to follow commands despite constan verbal, tactile and visual cuing. Patient is able to respond to yes/no questions only,patient does not reply to open ended questions.    Extremity/Trunk Assessment Upper Extremity Assessment Upper Extremity Assessment: Defer to OT evaluation Lower Extremity Assessment Lower Extremity Assessment: Generalized weakness Cervical / Trunk Assessment Cervical / Trunk Assessment: Kyphotic   Balance    End of Session PT - End of Session Activity Tolerance: Patient limited by lethargy;Patient limited by fatigue;Treatment limited secondary to medical complications (Comment);Patient limited by pain Patient left: in bed Nurse Communication: Mobility status  GP     Garv Kuechle R PT, DPT 12/29/2013, 11:41 AM

## 2013-12-29 NOTE — H&P (Signed)
Primary Care Physician:  Avon GullyFANTA,TESFAYE, MD Primary Gastroenterologist:  Dr. Darrick PennaFields  Pre-Procedure History & Physical: HPI:  [name] is a 80 y.o. female here for Anemia/on NSAIDS.  Past Medical History  Diagnosis Date  . Bipolar 1 disorder   . PTSD (post-traumatic stress disorder)   . HTN (hypertension)   . Dementia   . Anemia     Past Surgical History  Procedure Laterality Date  . Colonoscopy  OCT 2010    [place] TICS, SML IH  . Upper gastrointestinal endoscopy  APR 2011    NSAID GASTRITIS, INFLAMMATORY POLYP  . Total abdominal hysterectomy      Prior to Admission medications   Medication Sig Start Date End Date Taking? Authorizing Provider  acetaminophen (TYLENOL) 325 MG tablet Take 650 mg by mouth every 6 (six) hours as needed. pain   Yes Historical Provider, MD  divalproex (DEPAKOTE SPRINKLE) 125 MG capsule Take 250 mg by mouth 2 (two) times daily.  06/23/11  Yes Historical Provider, MD  EXELON 4.6 MG/24HR Place 1 patch onto the skin daily.  06/22/11  Yes Historical Provider, MD  furosemide (LASIX) 40 MG tablet Take 40 mg by mouth 2 (two) times daily.  06/23/11  Yes Historical Provider, MD  ibuprofen (ADVIL,MOTRIN) 400 MG tablet Take 400 mg by mouth 2 (two) times daily as needed. Knee pain   Yes Historical Provider, MD  Inulin (FIBERCHOICE) 2 G CHEW Chew 1 tablet (2 g total) by mouth 2 (two) times daily. 02/17/11  Yes Joselyn ArrowKandice L Jones, NP  losartan (COZAAR) 100 MG tablet Take 100 mg by mouth daily.  06/23/11  Yes Historical Provider, MD  mirtazapine (REMERON) 15 MG tablet Take 7.5 mg by mouth at bedtime.  06/23/11  Yes Historical Provider, MD  NAMENDA 10 MG tablet Take 10 mg by mouth daily.  06/23/11  Yes Historical Provider, MD  omeprazole (PRILOSEC) 20 MG capsule Take 20 mg by mouth daily.  06/23/11  Yes Historical Provider, MD  polyethylene glycol (MIRALAX / GLYCOLAX) packet Take 17 g by mouth daily.   Yes Historical Provider, MD  potassium chloride SA (K-DUR,KLOR-CON) 20 MEQ  tablet Take 40 mEq by mouth daily.  06/23/11  Yes Historical Provider, MD  simvastatin (ZOCOR) 20 MG tablet Take 20 mg by mouth at bedtime.  06/23/11  Yes Historical Provider, MD  traMADol (ULTRAM) 50 MG tablet Take 50 mg by mouth every 6 (six) hours as needed. pain 05/22/11   Historical Provider, MD    Allergies as of 12/28/2013  . (No Known Allergies)    Family History  Problem Relation Age of Onset  . Colon cancer Neg Hx   . Colon polyps Neg Hx     History   Social History  . Marital Status: Widowed    Spouse Name: N/A    Number of Children: N/A  . Years of Education: N/A   Occupational History  . Not on file.   Social History Main Topics  . Smoking status: Former Smoker -- 0.50 packs/day    Types: Cigarettes  . Smokeless tobacco: Not on file     Comment: years ago  . Alcohol Use: No  . Drug Use: No  . Sexual Activity: Not on file   Other Topics Concern  . Not on file   Social History Narrative  . No narrative on file    Review of Systems: See HPI, otherwise negative ROS   Physical Exam: BP 134/61  Pulse 105  Temp(Src) 97.9 F (36.6 C) (  Oral)  Resp 18  Ht 5' 6.14" (1.68 m)  Wt 196 lb 12.8 oz (89.268 kg)  BMI 31.63 kg/m2  SpO2 98% General:   Alert,  pleasant and cooperative in NAD Head:  Normocephalic and atraumatic. Neck:  Supple; Lungs:  Clear throughout to auscultation.    Heart:  Regular rate and rhythm. Abdomen:  Soft, nontender and nondistended. Normal bowel sounds, without guarding, and without rebound.   Neurologic:  Alert and  oriented x4;  grossly normal neurologically.  Impression/Plan:     Anemia/on NSAIDS.  PLAN:  1. EGD TODAY

## 2013-12-29 NOTE — Progress Notes (Signed)
Subjective: Admitted yesterday due to generalized weakness. Patient was found acute exacerbation of gout, anemia and acute renal failure. No hematemesis or malena  Objective: Vital signs in last 24 hours: Temp:  [96.7 F (35.9 C)-98.5 F (36.9 C)] 96.7 F (35.9 C) (03/20 0455) Pulse Rate:  [95-121] 113 (03/20 0455) Resp:  [16-18] 16 (03/20 0455) BP: (94-132)/(41-72) 122/72 mmHg (03/20 0455) SpO2:  [97 %-98 %] 97 % (03/20 0130) Weight:  [89.268 kg (196 lb 12.8 oz)] 89.268 kg (196 lb 12.8 oz) (03/19 2122) Weight change:  Last BM Date:  (pt unsure)  Intake/Output from previous day:    PHYSICAL EXAM General appearance: delirious and slowed mentation Resp: diminished breath sounds bilaterally and rhonchi bilaterally Cardio: S1, S2 normal GI: soft, non-tender; bowel sounds normal; no masses,  no organomegaly Extremities: tender swelling of the knees  Lab Results:    @labtest @ [facility] No results found for this basename: PHART, PCO2, PO2ART, TCO2, HCO3,  in the last 72 hours CULTURES Recent Results (from the past 240 hour(s))  BODY FLUID CULTURE     Status: None   Collection Time    12/28/13  5:25 PM      Result Value Ref Range Status   Specimen Description FLUID RIGHT KNEE   Final   Special Requests NONE   Final   Gram Stain     Final   Value: MODERATE WBC PRESENT, PREDOMINANTLY PMN     NO ORGANISMS SEEN     Performed at [facility]     [facility]formed at [facility]   Culture PENDING   Incomplete   Report Status PENDING   Incomplete   Studies/Results: Dg Hip Complete Right  12/28/2013   CLINICAL DATA:  Diffuse right leg pain  EXAM: RIGHT HIP - COMPLETE 2+ VIEW  COMPARISON:  Concurrently obtained radiographs of the right knee and foot  FINDINGS: No acute fracture or malalignment. Mild bilateral hip degenerative changes. Atherosclerotic calcifications noted in the right common femoral artery. The bony pelvis appears intact. Incompletely imaged mild degenerative  change in the lower lumbar spine. Aortoiliac calcifications noted. No focal soft tissue abnormality. Query mild constipation with a moderate formed stool in the rectum.  IMPRESSION: No acute osseous abnormality.  Atherosclerotic vascular calcifications.  Mild bilateral hip osteoarthritis.   Electronically Signed   By: Malachy MoanHeath  McCullough M.D.   On: 12/28/2013 16:46   [place] Venous Img Lower Unilateral Right  12/28/2013   CLINICAL DATA:  Positive D-dimer and leg swelling.  EXAM: RIGHT LOWER EXTREMITY VENOUS DOPPLER ULTRASOUND  TECHNIQUE: Gray-scale sonography with graded compression, as well as color Doppler and duplex ultrasound, were performed to evaluate the deep venous system from the level of the common femoral vein through the popliteal and proximal calf veins. Spectral Doppler was utilized to evaluate flow at rest and with distal augmentation maneuvers.  COMPARISON:  None.  FINDINGS: Thrombus within deep veins:  None visualized.  Normal compressibility, color Doppler flow and augmentation in the right common femoral vein, right femoral vein and right popliteal vein. Visualized calf veins are patent. The visualized great saphenous vein is patent.  Other findings:  Soft tissue edema in the calf and ankle.  IMPRESSION: Negative for right lower extremity DVT.   Electronically Signed   By: Richarda OverlieAdam  Henn M.D.   On: 12/28/2013 18:18   Dg Knee Complete 4 Views Left  12/28/2013   CLINICAL DATA:  Left lower extremity pain  EXAM: LEFT KNEE - COMPLETE 4+ VIEW  COMPARISON:  Prior radiographs of the left kidney 11/21/2010  FINDINGS: No acute fracture or malalignment. Advanced tricompartmental osteoarthritis most severe in the lateral compartment. There is a moderate suprapatellar knee joint effusion. Atherosclerotic calcifications noted in the popliteal artery. The bones appear osteopenic. Additional atherosclerotic calcifications are noted in the region of the runoff vessels.  IMPRESSION: 1. Moderate suprapatellar knee joint  effusion is favored to be degenerative in etiology. However, infection is difficult to exclude radiographically. 2. Tricompartmental degenerative osteoarthritis most significant in the lateral compartment. There is been modest interval progression compared to February of 2012. 3. Atherosclerotic vascular calcifications.   Electronically Signed   By: Malachy Moan M.D.   On: 12/28/2013 16:44   Dg Foot 2 Views Right  12/28/2013   CLINICAL DATA:  Diffuse right lower extremity pain  EXAM: RIGHT FOOT - 2 VIEW  COMPARISON:  Concurrently obtained radiographs of the knee and hip  FINDINGS: Hallux valgus angulation of the great toe. There is diffuse soft tissue swelling overlying the great toe MTP joint. Irregularity and rarefaction of the medial aspect of the head of the first metatarsal and base of the proximal phalanx of the great toe. In general, the bones appear osteopenic. No evidence acute fracture or malalignment. No other degenerative or destructive changes. Mild midfoot osteoarthritis. No ankle joint effusion.  IMPRESSION: Mild bony destructive changes in the medial aspect of the great toe MTP joint with associated overlying soft tissue swelling. Differential considerations include gout, and potentially osteomyelitis.   Electronically Signed   By: Malachy Moan M.D.   On: 12/28/2013 16:48    Medications: I have reviewed the patient's current medications.  Assesment: Principal Problem:   Gout Active Problems:   GIB (gastrointestinal bleeding)   Dementia acute renal failure  Plan: Medications reviewed Will gradually rehydrate Will start on protonox IV GI consult Will monitor cbc and BMP    LOS: 1 day   Aleia Larocca 12/29/2013, 8:03 AM
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
## 2013-12-29 NOTE — Progress Notes (Signed)
REVIEWED.  

## 2013-12-29 NOTE — Op Note (Signed)
[facility] [address] [place], [id number]   ENDOSCOPY PROCEDURE REPORT  PATIENT: Carly Valenzuela, Carly W.  MR#: [id number] BIRTHDATE: December 14, 1930 , 83  yrs. old GENDER: Female  ENDOSCOPIST: Jonette EvaSandi Kimberley Dastrup, MD REFERRED QI:ONGEXBMWBY:Tesafaye Fanta, M.D.  PROCEDURE DATE: 12/29/2013 PROCEDURE:   EGD w/ biopsy  INDICATIONS:Anemia. MEDICATIONS: Demerol 25 mg IV and Versed 2 mg IV TOPICAL ANESTHETIC:   Viscous Xylocaine  DESCRIPTION OF PROCEDURE:     Physical exam was performed.  Informed consent was obtained from the patient after explaining the benefits, risks, and alternatives to the procedure.  The patient was connected to the monitor and placed in the left lateral position.  Continuous oxygen was provided by nasal cannula and IV medicine administered through an indwelling cannula.  After administration of sedation, the patients esophagus was intubated and the EG-[id number] ([id number])  endoscope was advanced under direct visualization to the second portion of the duodenum.  The scope was removed slowly by carefully examining the color, texture, anatomy, and integrity of the mucosa on the way out.  The patient was recovered in endoscopy and discharged home in satisfactory condition.   ESOPHAGUS: The mucosa of the esophagus appeared normal.   A medium sized hiatal hernia was noted.   STOMACH: Mild non-erosive gastritis (inflammation) was found in the gastric antrum and gastric body.  Multiple biopsies were performed using cold forceps. DUODENUM: The duodenal mucosa showed no abnormalities in the bulb and second portion of the duodenum. COMPLICATIONS:   None  ENDOSCOPIC IMPRESSION: 1.   NO OBVIOUS SOURCE FOR ANEMIA IDENTIFIED. IT IS MOST LIKELY DUE TO CHRONIC DISEASE IN THE SETTING ON CR 1.3-1.9 AND ALBUMIN 2.2. 2.   Medium sized hiatal hernia 3.   MILD Non-erosive gastritis  RECOMMENDATIONS: DAILY PPI AWAIT BIOPSY SOFT DIET ATTEMPTED TO DISCUSS SUBSEQUENT MANAGEMENT WITH  POA(LISA)-LEFT VOICEMAIL.  REPEAT EXAM:   _______________________________ Carly DoctoreSignedJonette Eva:  Carly Morell, MD 12/29/2013 12:59 PM

## 2013-12-29 NOTE — Care Management Note (Addendum)
    Page 1 of 1   01/02/2014     9:09:19 AM   CARE MANAGEMENT NOTE 01/02/2014  Patient:  Vedia CofferKENDRICK,Dareth W   Account Number:  [id number]  Date Initiated:  12/29/2013  Documentation initiated by:  Sharrie RothmanBLACKWELL,Anh Bigos C  Subjective/Objective Assessment:   Pt admitted from [facility] with anemia. Pt may need higher level of care at discharge.     Action/Plan:   CSW aware of pts need and will arrange discharge to facility when medically stable.   Anticipated DC Date:  01/01/2014   Anticipated DC Plan:  SKILLED NURSING FACILITY  In-house referral  Clinical Social Worker      DC Planning Services  CM consult      Choice offered to / List presented to:             Status of service:  Completed, signed off Medicare Important Message given?  YES (If response is "NO", the following Medicare IM given date fields will be blank) Date Medicare IM given:  01/02/2014 Date Additional Medicare IM given:    Discharge Disposition:  SKILLED NURSING FACILITY  Per UR Regulation:    If discussed at Long Length of Stay Meetings, dates discussed:   01/02/2014    Comments:  01/02/14 0900 Arlyss Queenammy Lyllian Gause, RN BSN CM [facility] CSW to arrange discharge to facility.  12/29/13 1055 Arlyss Queenammy Morris Markham, RN BSN CM

## 2013-12-29 NOTE — Progress Notes (Signed)
UR chart review completed.  

## 2013-12-30 DIAGNOSIS — K2990 Gastroduodenitis, unspecified, without bleeding: Secondary | ICD-10-CM

## 2013-12-30 DIAGNOSIS — K922 Gastrointestinal hemorrhage, unspecified: Secondary | ICD-10-CM

## 2013-12-30 DIAGNOSIS — K2970 Gastritis, unspecified, without bleeding: Secondary | ICD-10-CM

## 2013-12-30 DIAGNOSIS — F0390 Unspecified dementia without behavioral disturbance: Secondary | ICD-10-CM

## 2013-12-30 DIAGNOSIS — D649 Anemia, unspecified: Secondary | ICD-10-CM

## 2013-12-30 DIAGNOSIS — K449 Diaphragmatic hernia without obstruction or gangrene: Secondary | ICD-10-CM

## 2013-12-30 LAB — BASIC METABOLIC PANEL
BUN: 47 mg/dL — ABNORMAL HIGH (ref 6–23)
CHLORIDE: 110 meq/L (ref 96–112)
CO2: 22 mEq/L (ref 19–32)
Calcium: 8.8 mg/dL (ref 8.4–10.5)
Creatinine, Ser: 1.59 mg/dL — ABNORMAL HIGH (ref 0.50–1.10)
GFR calc non Af Amer: 29 mL/min — ABNORMAL LOW (ref >90)
GFR, EST AFRICAN AMERICAN: 34 mL/min — AB (ref >90)
Glucose, Bld: 131 mg/dL — ABNORMAL HIGH (ref 70–99)
POTASSIUM: 4.4 meq/L (ref 3.7–5.3)
Sodium: 145 mEq/L (ref 137–147)

## 2013-12-30 LAB — TYPE AND SCREEN
ABO/RH(D): O POS
ANTIBODY SCREEN: NEGATIVE
Unit division: 0

## 2013-12-30 LAB — CBC
HCT: 25.4 % — ABNORMAL LOW (ref 36.0–46.0)
Hemoglobin: 8 g/dL — ABNORMAL LOW (ref 12.0–15.0)
MCH: 27.5 pg (ref 26.0–34.0)
MCHC: 31.5 g/dL (ref 30.0–36.0)
MCV: 87.3 fL (ref 78.0–100.0)
PLATELETS: 229 10*3/uL (ref 150–400)
RBC: 2.91 MIL/uL — ABNORMAL LOW (ref 3.87–5.11)
RDW: 15.6 % — AB (ref 11.5–15.5)
WBC: 5.5 10*3/uL (ref 4.0–10.5)

## 2013-12-30 LAB — URINALYSIS, ROUTINE W REFLEX MICROSCOPIC

## 2013-12-30 NOTE — Progress Notes (Signed)
Subjective: Patient is resting. She remained confused and disoriented. No hematemesis, melena or abdominal pain. He po intake is adequate. Objective: Vital signs in last 24 hours: Temp:  [97.4 F (36.3 C)-97.9 F (36.6 C)] 97.9 F (36.6 C) (03/21 0535) Pulse Rate:  [89-105] 97 (03/21 0535) Resp:  [14-26] 14 (03/21 0535) BP: (58-134)/(27-72) 128/72 mmHg (03/21 0535) SpO2:  [82 %-100 %] 100 % (03/21 0535) Weight change:  Last BM Date: 12/29/13  Intake/Output from previous day: 03/20 0701 - 03/21 0700 In: 120 [P.O.:120] Out: -   PHYSICAL EXAM General appearance: delirious and slowed mentation Resp: diminished breath sounds bilaterally and rhonchi bilaterally Cardio: S1, S2 normal GI: soft, non-tender; bowel sounds normal; no masses,  no organomegaly Extremities: tender swelling of the knees  Lab Results:    @labtest @ [facility] No results found for this basename: PHART, PCO2, PO2ART, TCO2, HCO3,  in the last 72 hours CULTURES Recent Results (from the past 240 hour(s))  BODY FLUID CULTURE     Status: None   Collection Time    12/28/13  5:25 PM      Result Value Ref Range Status   Specimen Description FLUID RIGHT KNEE   Final   Special Requests NONE   Final   Gram Stain     Final   Value: MODERATE WBC PRESENT, PREDOMINANTLY PMN     NO ORGANISMS SEEN     [facility]     Performed at [facility]   Culture     Final   Value: NO GROWTH     [facility]formed at [facility]   Report Status PENDING   Incomplete   Studies/Results: Dg Hip Complete Right  12/28/2013   CLINICAL DATA:  Diffuse right leg pain  EXAM: RIGHT HIP - COMPLETE 2+ VIEW  COMPARISON:  Concurrently obtained radiographs of the right knee and foot  FINDINGS: No acute fracture or malalignment. Mild bilateral hip degenerative changes. Atherosclerotic calcifications noted in the right common femoral artery. The bony pelvis appears intact. Incompletely imaged mild degenerative change in  the lower lumbar spine. Aortoiliac calcifications noted. No focal soft tissue abnormality. Query mild constipation with a moderate formed stool in the rectum.  IMPRESSION: No acute osseous abnormality.  Atherosclerotic vascular calcifications.  Mild bilateral hip osteoarthritis.   Electronically Signed   By: Malachy MoanHeath  McCullough M.D.   On: 12/28/2013 16:46   Ct Head Wo Contrast  12/29/2013   CLINICAL DATA:  Mental status change  EXAM: CT HEAD WITHOUT CONTRAST  TECHNIQUE: Contiguous axial images were obtained from the base of the skull through the vertex without intravenous contrast.  COMPARISON:  CT C SPINE W/O CM dated 02/02/2010  FINDINGS: No acute intracranial abnormality. Specifically, no hemorrhage, hydrocephalus, mass lesion, acute infarction, or significant intracranial injury. No acute calvarial abnormality. Global atrophy and mild diffuse areas of low attenuation within the subcortical, deep, and periventricular white matter regions. An air-fluid level and mucosal thickening appreciated in the left maxillary sinus. Remaining visualized paranasal sinuses mastoid air cells are patent.  IMPRESSION: 1. Stable chronic and involutional changes without evidence of acute abnormalities. 2. Findings which may reflect sinus disease in the left maxillary sinus.   Electronically Signed   By: Salome HolmesHector  Cooper M.D.   On: 12/29/2013 14:47   [place] Venous Img Lower Unilateral Right  12/28/2013   CLINICAL DATA:  Positive D-dimer and leg swelling.  EXAM: RIGHT LOWER EXTREMITY VENOUS DOPPLER ULTRASOUND  TECHNIQUE: Gray-scale sonography with graded compression, as well  as color Doppler and duplex ultrasound, were performed to evaluate the deep venous system from the level of the common femoral vein through the popliteal and proximal calf veins. Spectral Doppler was utilized to evaluate flow at rest and with distal augmentation maneuvers.  COMPARISON:  None.  FINDINGS: Thrombus within deep veins:  None visualized.  Normal  compressibility, color Doppler flow and augmentation in the right common femoral vein, right femoral vein and right popliteal vein. Visualized calf veins are patent. The visualized great saphenous vein is patent.  Other findings:  Soft tissue edema in the calf and ankle.  IMPRESSION: Negative for right lower extremity DVT.   Electronically Signed   By: Richarda Overlie M.D.   On: 12/28/2013 18:18   Dg Knee Complete 4 Views Left  12/28/2013   CLINICAL DATA:  Left lower extremity pain  EXAM: LEFT KNEE - COMPLETE 4+ VIEW  COMPARISON:  Prior radiographs of the left kidney 11/21/2010  FINDINGS: No acute fracture or malalignment. Advanced tricompartmental osteoarthritis most severe in the lateral compartment. There is a moderate suprapatellar knee joint effusion. Atherosclerotic calcifications noted in the popliteal artery. The bones appear osteopenic. Additional atherosclerotic calcifications are noted in the region of the runoff vessels.  IMPRESSION: 1. Moderate suprapatellar knee joint effusion is favored to be degenerative in etiology. However, infection is difficult to exclude radiographically. 2. Tricompartmental degenerative osteoarthritis most significant in the lateral compartment. There is been modest interval progression compared to February of 2012. 3. Atherosclerotic vascular calcifications.   Electronically Signed   By: Malachy Moan M.D.   On: 12/28/2013 16:44   Dg Foot 2 Views Right  12/28/2013   CLINICAL DATA:  Diffuse right lower extremity pain  EXAM: RIGHT FOOT - 2 VIEW  COMPARISON:  Concurrently obtained radiographs of the knee and hip  FINDINGS: Hallux valgus angulation of the great toe. There is diffuse soft tissue swelling overlying the great toe MTP joint. Irregularity and rarefaction of the medial aspect of the head of the first metatarsal and base of the proximal phalanx of the great toe. In general, the bones appear osteopenic. No evidence acute fracture or malalignment. No other  degenerative or destructive changes. Mild midfoot osteoarthritis. No ankle joint effusion.  IMPRESSION: Mild bony destructive changes in the medial aspect of the great toe MTP joint with associated overlying soft tissue swelling. Differential considerations include gout, and potentially osteomyelitis.   Electronically Signed   By: Malachy Moan M.D.   On: 12/28/2013 16:48    Medications: I have reviewed the patient's current medications.  Assesment: Principal Problem:   Gout Active Problems:   GIB (gastrointestinal bleeding)   Dementia acute renal failure  Plan: Medications reviewed Continue iv hydration GI consult aapreciated Will monitor cbc and BMP    LOS: 2 days   Jacques Willingham 12/30/2013, 10:33 AM

## 2013-12-30 NOTE — Progress Notes (Signed)
  Subjective:  Patient denies abdominal pain. According to nursing staff she ate less than 50% of her breakfast. She had formed brown stool yesterday.   Objective: Blood pressure 128/72, pulse 97, temperature 97.9 F (36.6 C), temperature source Axillary, resp. rate 14, height 5' 6.14" (1.68 m), weight 196 lb 12.8 oz (89.268 kg), SpO2 100.00%. Patient is somewhat lethargic but does respond to questions. Abdomen is soft and nontender without organomegaly or masses. No LE edema or clubbing noted.  Labs/studies Results: WBC 5.5, H&H 8 and 25.4 and platelet count 229K. Serum sodium 145, potassium 4.4, right 110, CO2 22, U. And 47, creatinine 1.59 and glucose 131. BUN and creatinine yesterday were 55 and 1.91 Gastric biopsy results pending. Urine culture pending.   Assessment:  #1. GI bleed with anemia. Patient underwent EGD by Dr. Darrick PennaFields and noted to have hiatal hernia and gastritis. Patient received 1 unit of PRBCs yesterday. H&H is low but stable and no evidence of overt bleed. #2. Azotemia possibly is due to dehydration. Renal function is improving. #3. Dementia.   Recommendations;  Push oral fluids. CBC and metabolic 7 in am.

## 2013-12-31 LAB — BASIC METABOLIC PANEL
BUN: 41 mg/dL — ABNORMAL HIGH (ref 6–23)
CO2: 21 mEq/L (ref 19–32)
Calcium: 9 mg/dL (ref 8.4–10.5)
Chloride: 115 mEq/L — ABNORMAL HIGH (ref 96–112)
Creatinine, Ser: 1.6 mg/dL — ABNORMAL HIGH (ref 0.50–1.10)
GFR, EST AFRICAN AMERICAN: 34 mL/min — AB (ref >90)
GFR, EST NON AFRICAN AMERICAN: 29 mL/min — AB (ref >90)
GLUCOSE: 126 mg/dL — AB (ref 70–99)
POTASSIUM: 4.6 meq/L (ref 3.7–5.3)
SODIUM: 149 meq/L — AB (ref 137–147)

## 2013-12-31 LAB — CBC
HCT: 26.1 % — ABNORMAL LOW (ref 36.0–46.0)
HEMOGLOBIN: 8 g/dL — AB (ref 12.0–15.0)
MCH: 27.1 pg (ref 26.0–34.0)
MCHC: 30.7 g/dL (ref 30.0–36.0)
MCV: 88.5 fL (ref 78.0–100.0)
Platelets: 215 10*3/uL (ref 150–400)
RBC: 2.95 MIL/uL — ABNORMAL LOW (ref 3.87–5.11)
RDW: 15.8 % — AB (ref 11.5–15.5)
WBC: 5.7 10*3/uL (ref 4.0–10.5)

## 2013-12-31 LAB — BODY FLUID CULTURE: Culture: NO GROWTH

## 2013-12-31 MED ORDER — SODIUM CHLORIDE 0.9 % IV SOLN
250.0000 mL | INTRAVENOUS | Status: DC
  Administered 2013-12-31 – 2014-01-01 (×2): 250 mL via INTRAVENOUS

## 2013-12-31 NOTE — Progress Notes (Signed)
Subjective; Patient has no complaints. She denies abdominal pain. According to nursing staff she is drinking fluids without any difficulty and ate most of her lunch. Her stools are brown.  Objective; BP 135/72  Pulse 116  Temp(Src) 98.1 F (36.7 C) (Oral)  Resp 18  Ht 5' 6.14" (1.68 m)  Wt 196 lb 12.8 oz (89.268 kg)  BMI 31.63 kg/m2  SpO2 99% Patient is alert and responds appropriately to simple questions. Conjunctiva is pale. Sclerae nonicteric. Abdomen is soft and nontender without organomegaly or masses. She does not have peripheral edema. She appears to have knee contractures.  Lab data; WBC 5.7, H&H 8.0 and 26.1 and platelet count 215K Sodium 149, serum potassium 4.6, chloride 1:15, CO2 21, BUN 41, creatinine 1.60 and glucose 126.   Assessment;  #1. Anemia secondary to GI bleed. No source documented an EGD which revealed hiatal hernia and gastritis. Patient received 1 unit of PRBCs and H&H is low but stable. #2. Dehydration. Serum sodium is going up BUN is coming down. Patient is not receiving IV fluids at the present time but she is drinking fluids without difficulty. #3. Abnormal urinalysis. Culture is pending. Will have staff check with Dr. Felecia ShellingFanta if she needs antibiotics.  Recommendations; Push oral fluids. Need for antibiotics to be determined by Dr. Felecia ShellingFanta.

## 2013-12-31 NOTE — Progress Notes (Signed)
Subjective: Patient is resting. No new complaint. Her renal function is improving. Objective: Vital signs in last 24 hours: Temp:  [98 F (36.7 C)-99 F (37.2 C)] 98.8 F (37.1 C) (03/22 0538) Pulse Rate:  [88-108] 100 (03/22 0538) Resp:  [12-18] 18 (03/22 0538) BP: (118-126)/(45-60) 125/60 mmHg (03/22 0538) SpO2:  [98 %-100 %] 99 % (03/22 0538) Weight change:  Last BM Date: 12/30/13  Intake/Output from previous day: 03/21 0701 - 03/22 0700 In: 563 [P.O.:560; I.V.:3] Out: -   PHYSICAL EXAM General appearance: delirious and slowed mentation Resp: diminished breath sounds bilaterally and rhonchi bilaterally Cardio: S1, S2 normal GI: soft, non-tender; bowel sounds normal; no masses,  no organomegaly Extremities: tender swelling of the knees  Lab Results:    @labtest @ [facility] No results found for this basename: PHART, PCO2, PO2ART, TCO2, HCO3,  in the last 72 hours CULTURES Recent Results (from the past 240 hour(s))  BODY FLUID CULTURE     Status: None   Collection Time    12/28/13  5:25 PM      Result Value Ref Range Status   Specimen Description FLUID RIGHT KNEE   Final   Special Requests NONE   Final   Gram Stain     Final   Value: MODERATE WBC PRESENT, PREDOMINANTLY PMN     NO ORGANISMS SEEN     Performed at [facility]     Performed at [facility]   Culture     Final   Value: NO GROWTH 2 DAYS     Performed at [facility]   Report Status PENDING   Incomplete   Studies/Results: Ct Head Wo Contrast  12/29/2013   CLINICAL DATA:  Mental status change  EXAM: CT HEAD WITHOUT CONTRAST  TECHNIQUE: Contiguous axial images were obtained from the base of the skull through the vertex without intravenous contrast.  COMPARISON:  CT C SPINE W/O CM dated 02/02/2010  FINDINGS: No acute intracranial abnormality. Specifically, no hemorrhage, hydrocephalus, mass lesion, acute infarction, or significant intracranial injury. No acute calvarial abnormality. Global  atrophy and mild diffuse areas of low attenuation within the subcortical, deep, and periventricular white matter regions. An air-fluid level and mucosal thickening appreciated in the left maxillary sinus. Remaining visualized paranasal sinuses mastoid air cells are patent.  IMPRESSION: 1. Stable chronic and involutional changes without evidence of acute abnormalities. 2. Findings which may reflect sinus disease in the left maxillary sinus.   Electronically Signed   By: Salome HolmesHector  Cooper M.D.   On: 12/29/2013 14:47    Medications: I have reviewed the patient's current medications.  Assesment: Principal Problem:   Gout Active Problems:   GIB (gastrointestinal bleeding)   Dementia acute renal failure  Plan: Medications reviewed Continue iv hydration Continue PPI Will monitor cbc and BMP    LOS: 3 days   Carly Valenzuela 12/31/2013, 8:51 AM

## 2014-01-01 ENCOUNTER — Encounter (HOSPITAL_COMMUNITY): Payer: Self-pay | Admitting: Gastroenterology

## 2014-01-01 DIAGNOSIS — R195 Other fecal abnormalities: Secondary | ICD-10-CM

## 2014-01-01 DIAGNOSIS — K2970 Gastritis, unspecified, without bleeding: Secondary | ICD-10-CM

## 2014-01-01 DIAGNOSIS — K2990 Gastroduodenitis, unspecified, without bleeding: Secondary | ICD-10-CM

## 2014-01-01 DIAGNOSIS — D649 Anemia, unspecified: Secondary | ICD-10-CM

## 2014-01-01 LAB — CBC
HCT: 25.2 % — ABNORMAL LOW (ref 36.0–46.0)
Hemoglobin: 7.8 g/dL — ABNORMAL LOW (ref 12.0–15.0)
MCH: 27.9 pg (ref 26.0–34.0)
MCHC: 31 g/dL (ref 30.0–36.0)
MCV: 90 fL (ref 78.0–100.0)
Platelets: 171 10*3/uL (ref 150–400)
RBC: 2.8 MIL/uL — AB (ref 3.87–5.11)
RDW: 16.1 % — ABNORMAL HIGH (ref 11.5–15.5)
WBC: 3.9 10*3/uL — AB (ref 4.0–10.5)

## 2014-01-01 LAB — BASIC METABOLIC PANEL
BUN: 29 mg/dL — ABNORMAL HIGH (ref 6–23)
CO2: 22 mEq/L (ref 19–32)
Calcium: 8.8 mg/dL (ref 8.4–10.5)
Chloride: 115 mEq/L — ABNORMAL HIGH (ref 96–112)
Creatinine, Ser: 1.49 mg/dL — ABNORMAL HIGH (ref 0.50–1.10)
GFR calc Af Amer: 37 mL/min — ABNORMAL LOW (ref >90)
GFR, EST NON AFRICAN AMERICAN: 32 mL/min — AB (ref >90)
Glucose, Bld: 124 mg/dL — ABNORMAL HIGH (ref 70–99)
Potassium: 4.3 mEq/L (ref 3.7–5.3)
SODIUM: 146 meq/L (ref 137–147)

## 2014-01-01 LAB — URINE CULTURE: Colony Count: >=100000

## 2014-01-01 LAB — PREPARE RBC (CROSSMATCH)

## 2014-01-01 MED ORDER — CIPROFLOXACIN HCL 250 MG PO TABS
500.0000 mg | ORAL_TABLET | Freq: Two times a day (BID) | ORAL | Status: DC
  Administered 2014-01-01 – 2014-01-02 (×3): 500 mg via ORAL
  Filled 2014-01-01 (×3): qty 2

## 2014-01-01 NOTE — Clinical Social Work Placement (Signed)
Clinical Social Work Department CLINICAL SOCIAL WORK PLACEMENT NOTE 01/01/2014  Patient:  Carly Valenzuela,Carly Valenzuela  Account Number:  [id number] Admit date:  12/28/2013  Clinical Social Worker:  Derenda FennelKARA Geoffry Bannister, LCSW  Date/time:  12/29/2013 04:01 PM  Clinical Social Work is seeking post-discharge placement for this patient at the following level of care:   SKILLED NURSING   (*CSW will update this form in Epic as items are completed)   12/29/2013  Patient/family provided with [facility] Department of Clinical Social Work's list of facilities offering this level of care within the geographic area requested by the patient (or if unable, by the patient's family).  12/29/2013  Patient/family informed of their freedom to choose among providers that offer the needed level of care, that participate in Medicare, Medicaid or managed care program needed by the patient, have an available bed and are willing to accept the patient.  12/29/2013  Patient/family informed of [facility]' ownership interest in [facility], as well as of the fact that they are under no obligation to receive care at this facility.  PASARR submitted to EDS on 12/29/2013 PASARR number received from EDS on 01/01/2014  FL2 transmitted to all facilities in geographic area requested by pt/family on  12/29/2013 FL2 transmitted to all facilities within larger geographic area on   Patient informed that his/her managed care company has contracts with or will negotiate with  certain facilities, including the following:     Patient/family informed of bed offers received:  01/01/2014 [facility] and [facility]  Patient to be transferred to  on   Patient to be transferred to facility by   The following physician request were entered in Epic:   Additional Comments:  Derenda FennelKara Nikkia Devoss, LCSW [phone]

## 2014-01-01 NOTE — Progress Notes (Signed)
Subjective: Patient is resting. She is growing proteus in her urine. No fever or chills. Her hemoglobin remain low. Objective: Vital signs in last 24 hours: Temp:  [98.1 F (36.7 C)-101.1 F (38.4 C)] 98.5 F (36.9 C) (03/23 0421) Pulse Rate:  [105-116] 105 (03/23 0421) Resp:  [16-18] 16 (03/23 0421) BP: (119-135)/(53-74) 119/53 mmHg (03/23 0421) SpO2:  [97 %-100 %] 100 % (03/23 0421) Weight change:  Last BM Date: 12/30/13  Intake/Output from previous day: 03/22 0701 - 03/23 0700 In: 1240 [P.O.:1240] Out: -   PHYSICAL EXAM General appearance: delirious and slowed mentation Resp: diminished breath sounds bilaterally and rhonchi bilaterally Cardio: S1, S2 normal GI: soft, non-tender; bowel sounds normal; no masses,  no organomegaly Extremities: tender swelling of the knees  Lab Results:    @labtest @ [facility] No results found for this basename: PHART, PCO2, PO2ART, TCO2, HCO3,  in the last 72 hours CULTURES Recent Results (from the past 240 hour(s))  BODY FLUID CULTURE     Status: None   Collection Time    12/28/13  5:25 PM      Result Value Ref Range Status   Specimen Description FLUID RIGHT KNEE   Final   Special Requests NONE   Final   Gram Stain     Final   Value: MODERATE WBC PRESENT, PREDOMINANTLY PMN     NO ORGANISMS SEEN     Performed at [facility]     Performed at [facility]   Culture     Final   Value: NO GROWTH 3 DAYS     Performed at [facility]   Report Status 12/31/2013 FINAL   Final  URINE CULTURE     Status: None   Collection Time    12/29/13  1:19 PM      Result Value Ref Range Status   Specimen Description URINE, CLEAN CATCH   Final   Special Requests NONE   Final   Culture  Setup Time     Final   Value: 12/30/2013 20:30     Performed at [facility]   Colony Count     Final   Value: >=100,000 COLONIES/ML     Performed at [facility]   Culture     Final   Value: PROTEUS MIRABILIS     Performed at  [facility]   Report Status PENDING   Incomplete   Studies/Results: No results found.  Medications: I have reviewed the patient's current medications.  Assesment: Principal Problem:   Gout Active Problems:   GIB (gastrointestinal bleeding)   Dementia acute renal failure UTI Plan: Medications reviewed Continue iv hydration Continue PPI Will start on oral cipro Will monitor cbc and BMP    LOS: 4 days   Toan Mort 01/01/2014, 8:08 AM

## 2014-01-01 NOTE — Progress Notes (Addendum)
    Subjective: Patient nods head in response to questions. Denies abdominal pain. Difficult to obtain any history. Nursing denies overt evidence of GI bleeding. Tolerating diet, fed by nurse tech.   Objective: Vital signs in last 24 hours: Temp:  [98.1 F (36.7 C)-101.1 F (38.4 C)] 98.5 F (36.9 C) (03/23 0421) Pulse Rate:  [105-116] 105 (03/23 0421) Resp:  [16-18] 16 (03/23 0421) BP: (119-135)/(53-74) 119/53 mmHg (03/23 0421) SpO2:  [97 %-100 %] 100 % (03/23 0421) Last BM Date: 12/30/13 General:   Alert to person.  Abdomen:  Bowel sounds present, soft, non-tender, non-distended. No HSM or hernias noted.  Extremities:  Without clubbing or edema. Neurologic:  Alert and  oriented to person. Skin:  Warm and dry, intact without significant lesions.  Psych:  Flat affect.   Intake/Output from previous day: 03/22 0701 - 03/23 0700 In: 1240 [P.O.:1240] Out: -  Intake/Output this shift:    Lab Results:  Recent Labs  12/30/13 0628 12/31/13 0612 01/01/14 0446  WBC 5.5 5.7 3.9*  HGB 8.0* 8.0* 7.8*  HCT 25.4* 26.1* 25.2*  PLT 229 215 171   BMET  Recent Labs  12/30/13 0628 12/31/13 0612 01/01/14 0446  NA 145 149* 146  K 4.4 4.6 4.3  CL 110 115* 115*  CO2 22 21 22   GLUCOSE 131* 126* 124*  BUN 47* 41* 29*  CREATININE 1.59* 1.60* 1.49*  CALCIUM 8.8 9.0 8.8     Assessment: 80 year old female with normocytic anemia and heme positive without overt evidence of GI bleeding; EGD with mild, non-erosive gastritis. Anemia likely multifactorial in the setting of chronic disease. Final path pending. Hgb with slight drift to 7.8; recheck in am. Consider an additional unit of PRBCs.   As of note, last colonoscopy October 2010 by Dr. Darrick PennaFields, with small internal hemorrhoids, sigmoid diverticulosis. Consider colonoscopy this admission if overt signs of GI bleeding; otherwise, could pursue as outpatient if persistent anemia.   UTI: started on Cipro per attending.    Plan: PPI  daily Follow-up on pathology from EGD Repeat CBC in am: if less than 8, recommend 1 additional unit PRBCs Consider inpatient colonoscopy if evidence of overt GI bleeding; otherwise, could pursue as outpatient Will continue to follow.  Nira RetortAnna W. Sams, ANP-BC [facility]     LOS: 4 days    01/01/2014, 7:38 AM    Attending note:  Pt seen and examined; discussed with nursing staff; agree with above

## 2014-01-01 NOTE — Progress Notes (Signed)
Physical Therapy Treatment Patient Details Name: Carly Valenzuela MRN: [id number] DOB: 06/18/1931 Today's Date: 01/01/2014 Time: [id number] PT Time Calculation (min): 25 min  PT Assessment / Plan / Recommendation     PT Comments   Pt with difficulty following commands.  Pt positioned nearly on Rt side bed railings with bent forward posture, in fetal position upon arrival.  Pt replied "yes" when asked if she would like to get OOB.  Despite multiple techniques and assistance, unable to get pt into full seated position from supine.  No attempts from patient to perform active movement and with therapist assist, would resist and return back into previous position when therapist released.  Pt with bilateral knee flexion contractures.  Attempted stretching, however patient with discomfort.  Stretched hips (Rt ER, Lt IR).  Encouraged pt to move more in bed, however unsure if understood.  Pt will require a lift to get OOB.  Plan Frequency needs to be updated as limited progress   Precautions / Restrictions Precautions Precautions: Fall       Mobility  Bed Mobility Overal bed mobility: +2 for physical assistance Bed Mobility: Rolling;Supine to Sit Rolling: +2 for physical assistance Supine to sit: +2 for physical assistance General bed mobility comments: patient does not move in bed despite verbal, visual, tactile cuing. Patient appears to have bilateral knee flexion contractures.  Pt resists assistance from therapist.  Transfers General transfer comment: Unable to assess as unable to fully come to seated position.   patient was unable to perform bed activities, patient is likely unable to perform transfers or gait.     Exercises  Unable to perform AROM as pt will not follow commands. PROM Hip and knee stretches only performed.   PT Goals (current goals can now be found in the care plan section)    Visit Information  Last PT Received On: 01/01/14 Assistance Needed: +2    Subjective Data  Pt with minimal verbalizations   Cognition  Cognition Behavior During Therapy: Flat affect Overall Cognitive Status: Impaired/Different from baseline Area of Impairment: Orientation;Awareness Orientation Level: Disoriented to;Place;Time;Situation Memory: Decreased short-term memory General Comments: Patient displays an inability to follow commands despite constan verbal, tactile and visual cuing. Patient is able to respond to yes/no questions only,patient does not reply to open ended questions.       End of Session PT - End of Session Activity Tolerance: Patient limited by lethargy;Patient limited by fatigue;Treatment limited secondary to medical complications (Comment);Patient limited by pain Patient left: in bed        Lurena NidaAmy B Kayne Yuhas, PTA/CLT 01/01/2014, 9:37 AM

## 2014-01-01 NOTE — Clinical Social Work Note (Signed)
Pt received pasarr today. CSW presented [facility] which [facility] POA's [facility] and she accepted. Facility notified. Misty StanleyLisa plans to [facility] today [facility] Unsure if she will be available tomorrow, but agreeable to [facility] if d/c and CSW will leave voicemail updating Misty StanleyLisa if needed.   Derenda FennelKara Soo [place], [place]CS[place] [phone]

## 2014-01-02 LAB — TYPE AND SCREEN
ABO/RH(D): O POS
ANTIBODY SCREEN: NEGATIVE
UNIT DIVISION: 0
Unit division: 0

## 2014-01-02 LAB — BASIC METABOLIC PANEL
BUN: 21 mg/dL (ref 6–23)
CALCIUM: 9 mg/dL (ref 8.4–10.5)
CO2: 20 mEq/L (ref 19–32)
Chloride: 115 mEq/L — ABNORMAL HIGH (ref 96–112)
Creatinine, Ser: 1.27 mg/dL — ABNORMAL HIGH (ref 0.50–1.10)
GFR calc non Af Amer: 38 mL/min — ABNORMAL LOW (ref >90)
GFR, EST AFRICAN AMERICAN: 45 mL/min — AB (ref >90)
Glucose, Bld: 137 mg/dL — ABNORMAL HIGH (ref 70–99)
Potassium: 4.5 mEq/L (ref 3.7–5.3)
Sodium: 147 mEq/L (ref 137–147)

## 2014-01-02 LAB — CBC
HCT: 34.5 % — ABNORMAL LOW (ref 36.0–46.0)
Hemoglobin: 11.1 g/dL — ABNORMAL LOW (ref 12.0–15.0)
MCH: 28.3 pg (ref 26.0–34.0)
MCHC: 32.2 g/dL (ref 30.0–36.0)
MCV: 88 fL (ref 78.0–100.0)
PLATELETS: 115 10*3/uL — AB (ref 150–400)
RBC: 3.92 MIL/uL (ref 3.87–5.11)
RDW: 16.1 % — AB (ref 11.5–15.5)
WBC: 4.6 10*3/uL (ref 4.0–10.5)

## 2014-01-02 MED ORDER — CIPROFLOXACIN HCL 500 MG PO TABS: 500.0000 mg | ORAL_TABLET | Freq: Two times a day (BID) | ORAL | Status: AC

## 2014-01-02 NOTE — Discharge Summary (Signed)
Physician Discharge Summary  Patient ID: Carly Valenzuela MRN: [id number] DOB/AGE: 1930/12/13 80 y.o. Primary Care Physician:Trusten Hume, MD Admit date: 12/28/2013 Discharge date: 01/02/2014    Discharge Diagnoses:   Principal Problem:   Gout Active Problems:   GIB (gastrointestinal bleeding)   Dementia anemia GI bleed UTI Acute renal failure    Medication List    STOP taking these medications       furosemide 40 MG tablet  Commonly known as:  LASIX     ibuprofen 400 MG tablet  Commonly known as:  ADVIL,MOTRIN      TAKE these medications       acetaminophen 325 MG tablet  Commonly known as:  TYLENOL  Take 650 mg by mouth every 6 (six) hours as needed. pain     ciprofloxacin 500 MG tablet  Commonly known as:  CIPRO  Take 1 tablet (500 mg total) by mouth 2 (two) times daily.     divalproex 125 MG capsule  Commonly known as:  DEPAKOTE SPRINKLE  Take 250 mg by mouth 2 (two) times daily.     EXELON 4.6 mg/24hr  Generic drug:  rivastigmine  Place 1 patch onto the skin daily.     Inulin 2 G Chew  Commonly known as:  FIBERCHOICE  Chew 1 tablet (2 g total) by mouth 2 (two) times daily.     losartan 100 MG tablet  Commonly known as:  COZAAR  Take 100 mg by mouth daily.     mirtazapine 15 MG tablet  Commonly known as:  REMERON  Take 7.5 mg by mouth at bedtime.     NAMENDA 10 MG tablet  Generic drug:  memantine  Take 10 mg by mouth daily.     omeprazole 20 MG capsule  Commonly known as:  PRILOSEC  Take 20 mg by mouth daily.     polyethylene glycol packet  Commonly known as:  MIRALAX / GLYCOLAX  Take 17 g by mouth daily.     potassium chloride SA 20 MEQ tablet  Commonly known as:  K-DUR,KLOR-CON  Take 40 mEq by mouth daily.     simvastatin 20 MG tablet  Commonly known as:  ZOCOR  Take 20 mg by mouth at bedtime.     traMADol 50 MG tablet  Commonly known as:  ULTRAM  Take 50 mg by mouth every 6 (six) hours as needed. pain        Discharged  Condition: improved    Consults: GI  Significant Diagnostic Studies: Dg Hip Complete Right  12/28/2013   CLINICAL DATA:  Diffuse right leg pain  EXAM: RIGHT HIP - COMPLETE 2+ VIEW  COMPARISON:  Concurrently obtained radiographs of the right knee and foot  FINDINGS: No acute fracture or malalignment. Mild bilateral hip degenerative changes. Atherosclerotic calcifications noted in the right common femoral artery. The bony pelvis appears intact. Incompletely imaged mild degenerative change in the lower lumbar spine. Aortoiliac calcifications noted. No focal soft tissue abnormality. Query mild constipation with a moderate formed stool in the rectum.  IMPRESSION: No acute osseous abnormality.  Atherosclerotic vascular calcifications.  Mild bilateral hip osteoarthritis.   Electronically Signed   By: Malachy Moan M.D.   On: 12/28/2013 16:46   Ct Head Wo Contrast  12/29/2013   CLINICAL DATA:  Mental status change  EXAM: CT HEAD WITHOUT CONTRAST  TECHNIQUE: Contiguous axial images were obtained from the base of the skull through the vertex without intravenous contrast.  COMPARISON:  CT C SPINE W/O CM  dated 02/02/2010  FINDINGS: No acute intracranial abnormality. Specifically, no hemorrhage, hydrocephalus, mass lesion, acute infarction, or significant intracranial injury. No acute calvarial abnormality. Global atrophy and mild diffuse areas of low attenuation within the subcortical, deep, and periventricular white matter regions. An air-fluid level and mucosal thickening appreciated in the left maxillary sinus. Remaining visualized paranasal sinuses mastoid air cells are patent.  IMPRESSION: 1. Stable chronic and involutional changes without evidence of acute abnormalities. 2. Findings which may reflect sinus disease in the left maxillary sinus.   Electronically Signed   By: Salome Holmes M.D.   On: 12/29/2013 14:47   US Venous Img Lower Unilateral Right  12/28/2013   CLINICAL DATA:  Positive D-dimer and  leg swelling.  EXAM: RIGHT LOWER EXTREMITY VENOUS DOPPLER ULTRASOUND  TECHNIQUE: Gray-scale sonography with graded compression, as well as color Doppler and duplex ultrasound, were performed to evaluate the deep venous system from the level of the common femoral vein through the popliteal and proximal calf veins. Spectral Doppler was utilized to evaluate flow at rest and with distal augmentation maneuvers.  COMPARISON:  None.  FINDINGS: Thrombus within deep veins:  None visualized.  Normal compressibility, color Doppler flow and augmentation in the right common femoral vein, right femoral vein and right popliteal vein. Visualized calf veins are patent. The visualized great saphenous vein is patent.  Other findings:  Soft tissue edema in the calf and ankle.  IMPRESSION: Negative for right lower extremity DVT.   Electronically Signed   By: Richarda Overlie M.D.   On: 12/28/2013 18:18   Dg Knee Complete 4 Views Left  12/28/2013   CLINICAL DATA:  Left lower extremity pain  EXAM: LEFT KNEE - COMPLETE 4+ VIEW  COMPARISON:  Prior radiographs of the left kidney 11/21/2010  FINDINGS: No acute fracture or malalignment. Advanced tricompartmental osteoarthritis most severe in the lateral compartment. There is a moderate suprapatellar knee joint effusion. Atherosclerotic calcifications noted in the popliteal artery. The bones appear osteopenic. Additional atherosclerotic calcifications are noted in the region of the runoff vessels.  IMPRESSION: 1. Moderate suprapatellar knee joint effusion is favored to be degenerative in etiology. However, infection is difficult to exclude radiographically. 2. Tricompartmental degenerative osteoarthritis most significant in the lateral compartment. There is been modest interval progression compared to February of 2012. 3. Atherosclerotic vascular calcifications.   Electronically Signed   By: Malachy Moan M.D.   On: 12/28/2013 16:44   Dg Foot 2 Views Right  12/28/2013   CLINICAL DATA:   Diffuse right lower extremity pain  EXAM: RIGHT FOOT - 2 VIEW  COMPARISON:  Concurrently obtained radiographs of the knee and hip  FINDINGS: Hallux valgus angulation of the great toe. There is diffuse soft tissue swelling overlying the great toe MTP joint. Irregularity and rarefaction of the medial aspect of the head of the first metatarsal and base of the proximal phalanx of the great toe. In general, the bones appear osteopenic. No evidence acute fracture or malalignment. No other degenerative or destructive changes. Mild midfoot osteoarthritis. No ankle joint effusion.  IMPRESSION: Mild bony destructive changes in the medial aspect of the great toe MTP joint with associated overlying soft tissue swelling. Differential considerations include gout, and potentially osteomyelitis.   Electronically Signed   By: Malachy Moan M.D.   On: 12/28/2013 16:48    Lab Results: Basic Metabolic Panel:  Recent Labs  40/98/11 0446 01/02/14 0512  NA 146 147  K 4.3 4.5  CL 115* 115*  CO2 22 20  GLUCOSE 124* 137*  BUN 29* 21  CREATININE 1.49* 1.27*  CALCIUM 8.8 9.0   Liver Function Tests: No results found for this basename: AST, ALT, ALKPHOS, BILITOT, PROT, ALBUMIN,  in the last 72 hours   CBC:  Recent Labs  01/01/14 0446 01/02/14 0512  WBC 3.9* 4.6  HGB 7.8* 11.1*  HCT 25.2* 34.5*  MCV 90.0 88.0  PLT 171 115*    Recent Results (from the past 240 hour(s))  BODY FLUID CULTURE     Status: None   Collection Time    12/28/13  5:25 PM      Result Value Ref Range Status   Specimen Description FLUID RIGHT KNEE   Final   Special Requests NONE   Final   Gram Stain     Final   Value: MODERATE WBC PRESENT, PREDOMINANTLY PMN     NO ORGANISMS SEEN     [facility]     [facility]formed at [facility]   Culture     Final   Value: NO GROWTH 3 DAYS     Performed at [facility]   Report Status 12/31/2013 FINAL   Final  URINE CULTURE     Status: None   Collection  Time    12/29/13  1:19 PM      Result Value Ref Range Status   Specimen Description URINE, CLEAN CATCH   Final   Special Requests NONE   Final   Culture  Setup Time     Final   Value: 12/30/2013 20:30     Performed at [facility]   Colony Count     Final   Value: >=100,000 COLONIES/ML     Performed at [facility]   Culture     Final   Value: PROTEUS MIRABILIS     Performed at [facility]   Report Status 01/01/2014 FINAL   Final   Organism ID, Bacteria PROTEUS MIRABILIS   Final     Hospital Course:  This is an 80 years old female with history of multiple medical illnesses who was admitted due to knee pain and generalized weakness. She was found to have gout, GI bleed and UTI. Patient was rehydrated and transfused 2 units. She is on cipro for UTI. Overall improved and she is being discharged in stable condition.  Discharge Exam: Blood pressure 146/79, pulse 100, temperature 98.3 F (36.8 C), temperature source Oral, resp. rate 19, height 5' 6.14" (1.68 m), weight 89.268 kg (196 lb 12.8 oz), SpO2 98.00%.   Disposition:  Nursing home      Signed: Chrishawn Boley   01/02/2014, 8:45 AM

## 2014-01-02 NOTE — Progress Notes (Signed)
UR chart review completed.  

## 2014-01-02 NOTE — Clinical Social Work Placement (Signed)
Clinical Social Work Department CLINICAL SOCIAL WORK PLACEMENT NOTE 01/02/2014  Patient:  Carly Valenzuela,Carly Valenzuela  Account Number:  [id number] Admit date:  12/28/2013  Clinical Social Worker:  Derenda FennelKARA Caillou Minus, LCSW  Date/time:  12/29/2013 04:01 PM  Clinical Social Work is seeking post-discharge placement for this patient at the following level of care:   SKILLED NURSING   (*CSW will update this form in Epic as items are completed)   12/29/2013  Patient/family provided with [facility] Department of Clinical Social Work's list of facilities offering this level of care within the geographic area requested by the patient (or if unable, by the patient's family).  12/29/2013  Patient/family informed of their freedom to choose among providers that offer the needed level of care, that participate in Medicare, Medicaid or managed care program needed by the patient, have an available bed and are willing to accept the patient.  12/29/2013  Patient/family informed of [facility]' ownership interest in [facility], as well as of the fact that they are under no obligation to receive care at this facility.  PASARR submitted to EDS on 12/29/2013 PASARR number received from EDS on 01/01/2014  FL2 transmitted to all facilities in geographic area requested by pt/family on  12/29/2013 FL2 transmitted to all facilities within larger geographic area on   Patient informed that his/her managed care company has contracts with or will negotiate with  certain facilities, including the following:     Patient/family informed of bed offers received:  01/01/2014 [facility] and [facility]  [facility]  01/02/2014 [facility]  The following physician request were entered in Epic:   Additional Comments:  Derenda FennelKara  Manju Kulkarni, LCSW [phone]

## 2014-01-02 NOTE — Clinical Social Work Note (Signed)
Pt d/[facility] today [facility]. Misty StanleyLisa and facility aware and agreeable. [facility]. D/C summary faxed. [facility] aware no script left for Tramadol and will take care of.  Derenda FennelKara Jabri Blancett, LCSW [phone]

## 2016-05-24 ENCOUNTER — Emergency Department (HOSPITAL_COMMUNITY): Payer: Medicare Other

## 2016-05-24 ENCOUNTER — Encounter (HOSPITAL_COMMUNITY): Payer: Self-pay | Admitting: *Deleted

## 2016-05-24 ENCOUNTER — Emergency Department (HOSPITAL_COMMUNITY)
Admission: EM | Admit: 2016-05-24 | Discharge: 2016-05-24 | Disposition: A | Discharge status: Home / Self Care | Payer: Medicare Other | Attending: Emergency Medicine | Admitting: Emergency Medicine

## 2016-05-24 DIAGNOSIS — Z87891 Personal history of nicotine dependence: Secondary | ICD-10-CM | POA: Insufficient documentation

## 2016-05-24 DIAGNOSIS — I10 Essential (primary) hypertension: Secondary | ICD-10-CM | POA: Insufficient documentation

## 2016-05-24 DIAGNOSIS — Z79899 Other long term (current) drug therapy: Secondary | ICD-10-CM | POA: Insufficient documentation

## 2016-05-24 DIAGNOSIS — J449 Chronic obstructive pulmonary disease, unspecified: Secondary | ICD-10-CM | POA: Diagnosis not present

## 2016-05-24 DIAGNOSIS — R29898 Other symptoms and signs involving the musculoskeletal system: Secondary | ICD-10-CM

## 2016-05-24 DIAGNOSIS — I619 Nontraumatic intracerebral hemorrhage, unspecified: Secondary | ICD-10-CM | POA: Insufficient documentation

## 2016-05-24 DIAGNOSIS — Z794 Long term (current) use of insulin: Secondary | ICD-10-CM | POA: Insufficient documentation

## 2016-05-24 DIAGNOSIS — R000 Tachycardia, unspecified: Secondary | ICD-10-CM | POA: Insufficient documentation

## 2016-05-24 DIAGNOSIS — M6281 Muscle weakness (generalized): Secondary | ICD-10-CM | POA: Diagnosis not present

## 2016-05-24 HISTORY — DX: Anxiety disorder, unspecified: F41.9

## 2016-05-24 HISTORY — DX: Disorder of kidney and ureter, unspecified: N28.9

## 2016-05-24 HISTORY — DX: Gout, unspecified: M10.9

## 2016-05-24 HISTORY — DX: Hypomagnesemia: E83.42

## 2016-05-24 HISTORY — DX: Gastro-esophageal reflux disease without esophagitis: K21.9

## 2016-05-24 HISTORY — DX: Contracture, unspecified shoulder: M24.519

## 2016-05-24 HISTORY — DX: Gastrointestinal hemorrhage, unspecified: K92.2

## 2016-05-24 HISTORY — DX: Essential (primary) hypertension: I10

## 2016-05-24 HISTORY — DX: Hyperkalemia: E87.5

## 2016-05-24 HISTORY — DX: Chronic obstructive pulmonary disease, unspecified: J44.9

## 2016-05-24 HISTORY — DX: Urinary tract infection, site not specified: N39.0

## 2016-05-24 HISTORY — DX: Age-related osteoporosis without current pathological fracture: M81.0

## 2016-05-24 LAB — COMPREHENSIVE METABOLIC PANEL
ALK PHOS: 109 U/L (ref 38–126)
ALT: 11 U/L — AB (ref 14–54)
ANION GAP: 10 (ref 5–15)
AST: 12 U/L — ABNORMAL LOW (ref 15–41)
Albumin: 3.4 g/dL — ABNORMAL LOW (ref 3.5–5.0)
BUN: 26 mg/dL — ABNORMAL HIGH (ref 6–20)
CO2: 24 mmol/L (ref 22–32)
Calcium: 9.1 mg/dL (ref 8.9–10.3)
Chloride: 101 mmol/L (ref 101–111)
Creatinine, Ser: 1.51 mg/dL — ABNORMAL HIGH (ref 0.44–1.00)
GFR, EST AFRICAN AMERICAN: 36 mL/min — AB (ref >60)
GFR, EST NON AFRICAN AMERICAN: 31 mL/min — AB (ref >60)
Glucose, Bld: 144 mg/dL — ABNORMAL HIGH (ref 65–99)
Potassium: 4 mmol/L (ref 3.5–5.1)
Sodium: 135 mmol/L (ref 135–145)
TOTAL PROTEIN: 7.5 g/dL (ref 6.5–8.1)
Total Bilirubin: 0.8 mg/dL (ref 0.3–1.2)

## 2016-05-24 LAB — CBC WITH DIFFERENTIAL/PLATELET
Basophils Absolute: 0 10*3/uL (ref 0.0–0.1)
Basophils Relative: 0 %
EOS ABS: 0 10*3/uL (ref 0.0–0.7)
Eosinophils Relative: 0 %
HEMATOCRIT: 34.6 % — AB (ref 36.0–46.0)
HEMOGLOBIN: 11 g/dL — AB (ref 12.0–15.0)
LYMPHS ABS: 0.8 10*3/uL (ref 0.7–4.0)
LYMPHS PCT: 8 %
MCH: 28.1 pg (ref 26.0–34.0)
MCHC: 31.8 g/dL (ref 30.0–36.0)
MCV: 88.3 fL (ref 78.0–100.0)
MONOS PCT: 7 %
Monocytes Absolute: 0.7 10*3/uL (ref 0.1–1.0)
NEUTROS PCT: 85 %
Neutro Abs: 9 10*3/uL — ABNORMAL HIGH (ref 1.7–7.7)
Platelets: 261 10*3/uL (ref 150–400)
RBC: 3.92 MIL/uL (ref 3.87–5.11)
RDW: 14.5 % (ref 11.5–15.5)
WBC: 10.5 10*3/uL (ref 4.0–10.5)

## 2016-05-24 LAB — URINE MICROSCOPIC-ADD ON

## 2016-05-24 LAB — URINALYSIS, ROUTINE W REFLEX MICROSCOPIC
Bilirubin Urine: NEGATIVE
Glucose, UA: NEGATIVE mg/dL
KETONES UR: NEGATIVE mg/dL
LEUKOCYTES UA: NEGATIVE
NITRITE: NEGATIVE
Protein, ur: 100 mg/dL — AB
Specific Gravity, Urine: 1.02 (ref 1.005–1.030)
pH: 5.5 (ref 5.0–8.0)

## 2016-05-24 MED ORDER — CLONIDINE HCL 0.1 MG PO TABS: 0.1000 mg | ORAL_TABLET | Freq: Once | ORAL | Status: DC

## 2016-05-24 NOTE — Discharge Instructions (Signed)
Patient's blood pressure improved in the ED without any specific treatment for her blood pressure. Her left upper extremity is flaccid and has some diffuse swelling, her CT scan does not show any acute stroke. When I palpate her arm there is no obvious fracture. Her chest x-ray does not show a pneumonia now so finish the antibiotic treatment she has been getting. Her urinalysis today does not show an infection so the Rocephin must be working for that infection also. Please have her doctors reassess her soon.

## 2016-05-24 NOTE — ED Notes (Signed)
Spoke with Kristie CowmanLisa Herbin, patient's niece, informed her that she would be returning to the nursing home.

## 2016-05-24 NOTE — ED Notes (Signed)
100,000 colonies per ml of Lactose fermenting gram negative rods with sensitivities to follow, per nurse [facility].

## 2016-05-24 NOTE — ED Provider Notes (Signed)
[facility]-EMERGENCY DEPT Provider Note   CSN: [id number] Arrival date & time: 05/24/16  0103  First Provider Contact:  First MD Initiated Contact with Patient 05/24/16 0130        History   Chief Complaint Chief Complaint  Patient presents with  . Hypertension   Level V caveat for dementia  HPI Carly Valenzuela is a 82 y.o. female.  HPI patient has a history of dementia presents from her nursing home. They report patient is normally alert however she is nonverbal and does not follow commands. She was started on Rocephin on August 11 for pneumonia and receives 1 g IM dailyThrough August 16. She also had a recent urine culture done which showed 100,000 colonies of gram-negative rods however the sensitivity is not back yet. They noted her left upper extremity seemed to be flaccid about 2 days ago. She is also noted to have contractures of her extremities. They were concerned tonight because her blood pressure has been high and tonight it was 230/140 and did not improve after given nitroglycerin twice.  PCP Dr Benedetto Goad and Dr Lynder Parents is DO NOT RESUSCITATE  Past Medical History:  Diagnosis Date  . Anemia   . Anemia   . Anxiety   . Bipolar 1 disorder (HCC)   . Contracture of shoulder    bilateral  . COPD (chronic obstructive pulmonary disease) (HCC)   . Dementia   . GERD (gastroesophageal reflux disease)   . GI bleed   . Gout   . HTN (hypertension)   . Hyperkalemia   . Hypertension   . Hypomagnesemia   . Osteoporosis   . PTSD (post-traumatic stress disorder)   . Renal disorder   . UTI (lower urinary tract infection)     Patient Active Problem List   Diagnosis Date Noted  . GIB (gastrointestinal bleeding) 12/28/2013  . Gout 12/28/2013  . Dementia 12/28/2013  . Knee pain, bilateral 07/02/2011  . LEG PAIN, BILATERAL 05/27/2010  . CONSTIPATION, CHRONIC 07/25/2009    Past Surgical History:  Procedure Laterality Date  . COLONOSCOPY  OCT 2010   [place] [facility],  [facility]  . ESOPHAGOGASTRODUODENOSCOPY N/A 12/29/2013   Procedure: ESOPHAGOGASTRODUODENOSCOPY (EGD);  Surgeon: West Bali, MD;  Location: [facility] ENDO SUITE;  Service: Endoscopy;  Laterality: N/A;  . TOTAL ABDOMINAL HYSTERECTOMY    . UPPER GASTROINTESTINAL ENDOSCOPY  APR 2011   NSAID GASTRITIS, INFLAMMATORY POLYP    OB History    No data available       Home Medications    Prior to Admission medications   Medication Sig Start Date End Date Taking? Authorizing Provider  acetaminophen (TYLENOL) 325 MG tablet Take 650 mg by mouth every 6 (six) hours as needed. pain   Yes Historical Provider, MD  albuterol (PROVENTIL) (2.5 MG/3ML) 0.083% nebulizer solution Take 2.5 mg by nebulization every 6 (six) hours as needed for wheezing or shortness of breath.   Yes Historical Provider, MD  cefTRIAXone (ROCEPHIN) 1 g SOLR injection Inject 1 g into the muscle daily.   Yes Historical Provider, MD  cholecalciferol (VITAMIN D) 1000 units tablet Take 1,000 Units by mouth daily.   Yes Historical Provider, MD  divalproex (DEPAKOTE SPRINKLE) 125 MG capsule Take 125 mg by mouth 2 (two) times daily.  06/23/11  Yes Historical Provider, MD  docusate sodium (COLACE) 100 MG capsule Take 100 mg by mouth daily.   Yes Historical Provider, MD  ferrous sulfate 325 (65 FE) MG tablet Take 325 mg  by mouth daily with breakfast.   Yes Historical Provider, MD  Inulin (FIBERCHOICE) 2 G CHEW Chew 1 tablet (2 g total) by mouth 2 (two) times daily. 02/17/11  Yes Joselyn ArrowKandice L Jones, NP  magnesium oxide (MAG-OX) 400 MG tablet Take 400 mg by mouth daily.   Yes Historical Provider, MD  nitroGLYCERIN (NITROSTAT) 0.4 MG SL tablet Place 0.4 mg under the tongue every 5 (five) minutes as needed for chest pain.   Yes Historical Provider, MD  polyethylene glycol (MIRALAX / GLYCOLAX) packet Take 17 g by mouth daily.   Yes Historical Provider, MD  ranitidine (ZANTAC) 150 MG tablet Take 150 mg by mouth daily.   Yes Historical Provider, MD  ciprofloxacin  (CIPRO) 500 MG tablet Take 1 tablet (500 mg total) by mouth 2 (two) times daily. 01/02/14   Avon Gullyesfaye Fanta, MD  EXELON 4.6 MG/24HR Place 1 patch onto the skin daily.  06/22/11   Historical Provider, MD  losartan (COZAAR) 100 MG tablet Take 50 mg by mouth daily.  06/23/11   Historical Provider, MD  mirtazapine (REMERON) 15 MG tablet Take 7.5 mg by mouth at bedtime.  06/23/11   Historical Provider, MD  NAMENDA 10 MG tablet Take 10 mg by mouth daily.  06/23/11   Historical Provider, MD  omeprazole (PRILOSEC) 20 MG capsule Take 20 mg by mouth daily.  06/23/11   Historical Provider, MD  potassium chloride SA (K-DUR,KLOR-CON) 20 MEQ tablet Take 40 mEq by mouth daily.  06/23/11   Historical Provider, MD  simvastatin (ZOCOR) 20 MG tablet Take 20 mg by mouth at bedtime.  06/23/11   Historical Provider, MD  traMADol (ULTRAM) 50 MG tablet Take 50 mg by mouth every 6 (six) hours as needed. pain 05/22/11   Historical Provider, MD    Family History Family History  Problem Relation Age of Onset  . Colon cancer Neg Hx   . Colon polyps Neg Hx     Social History Social History  Substance Use Topics  . Smoking status: Former Smoker    Packs/day: 0.50    Types: Cigarettes  . Smokeless tobacco: Never Used     Comment: years ago  . Alcohol use No  Patient is nonambulatory, she needs assistance with feeding and cannot perform any ADLs Patient lives in a nursing facility   Allergies   Review of patient's allergies indicates no known allergies.   Review of Systems Review of Systems  Unable to perform ROS: Dementia     Physical Exam Updated Vital Signs BP (!) 173/103   Pulse 120   Temp 99.4 F (37.4 C) (Rectal)   Resp 21   Wt 196 lb (88.9 kg)   SpO2 98%   BMI 31.50 kg/m   Vital signs normal except for hypertension and tachycardia   Physical Exam  Constitutional: She appears well-developed and well-nourished.  Non-toxic appearance. She does not appear ill. No distress.  HENT:  Head:  Normocephalic and atraumatic.  Right Ear: External ear normal.  Left Ear: External ear normal.  Nose: Nose normal. No mucosal edema or rhinorrhea.  Mouth/Throat: Mucous membranes are normal. No dental abscesses or uvula swelling.  Patient will not open her mouth to command  Eyes: Conjunctivae and EOM are normal. Pupils are equal, round, and reactive to light.  Neck: Normal range of motion and full passive range of motion without pain. Neck supple.  Cardiovascular: Regular rhythm and normal heart sounds.  Tachycardia present.  Exam reveals no gallop and no friction rub.  No murmur heard. Pulmonary/Chest: Effort normal and breath sounds normal. No respiratory distress. She has no wheezes. She has no rhonchi. She has no rales. She exhibits no tenderness and no crepitus.  Patient has rattling in her chest when she breathes, she appears to be too weak to cough up the mucus.  Abdominal: Soft. Normal appearance and bowel sounds are normal. She exhibits no distension. There is no tenderness. There is no rebound and no guarding.  Musculoskeletal: Normal range of motion. She exhibits edema. She exhibits no tenderness.  Patient is noted to be using her right upper extremity to just reach at nothing in particular, she started to have some spontaneous movement of her left lower extremity and when I attempt to do Babinski she moves both lower extremities. Patient's left upper extremity has some mild diffuse swelling and her left upper extremity is flaccid. She has no spontaneous movement of her left upper extremity.  Neurological: She is alert. She has normal strength. No cranial nerve deficit.  Skin: Skin is warm, dry and intact. No rash noted. No erythema. No pallor.  Psychiatric: Her mood appears not anxious. Her affect is blunt. She is withdrawn. She is noncommunicative.  Nursing note and vitals reviewed.    ED Treatments / Results  Labs (all labs ordered are listed, but only abnormal results are  displayed) Results for orders placed or performed during the hospital encounter of 05/24/16  Culture, blood (routine x 2)  Result Value Ref Range   Specimen Description BLOOD RIGHT ARM    Special Requests BOTTLES DRAWN AEROBIC ONLY 6CC    Culture PENDING    Report Status PENDING   Culture, blood (routine x 2)  Result Value Ref Range   Specimen Description BLOOD RIGHT ARM    Special Requests BOTTLES DRAWN AEROBIC ONLY 6CC    Culture PENDING    Report Status PENDING   CBC with Differential  Result Value Ref Range   WBC 10.5 4.0 - 10.5 K/uL   RBC 3.92 3.87 - 5.11 MIL/uL   Hemoglobin 11.0 (L) 12.0 - 15.0 g/dL   HCT 16.1 (L) 09.6 - 04.5 %   MCV 88.3 78.0 - 100.0 fL   MCH 28.1 26.0 - 34.0 pg   MCHC 31.8 30.0 - 36.0 g/dL   RDW 40.9 81.1 - 91.4 %   Platelets 261 150 - 400 K/uL   Neutrophils Relative % 85 %   Neutro Abs 9.0 (H) 1.7 - 7.7 K/uL   Lymphocytes Relative 8 %   Lymphs Abs 0.8 0.7 - 4.0 K/uL   Monocytes Relative 7 %   Monocytes Absolute 0.7 0.1 - 1.0 K/uL   Eosinophils Relative 0 %   Eosinophils Absolute 0.0 0.0 - 0.7 K/uL   Basophils Relative 0 %   Basophils Absolute 0.0 0.0 - 0.1 K/uL  Comprehensive metabolic panel  Result Value Ref Range   Sodium 135 135 - 145 mmol/L   Potassium 4.0 3.5 - 5.1 mmol/L   Chloride 101 101 - 111 mmol/L   CO2 24 22 - 32 mmol/L   Glucose, Bld 144 (H) 65 - 99 mg/dL   BUN 26 (H) 6 - 20 mg/dL   Creatinine, Ser 7.82 (H) 0.44 - 1.00 mg/dL   Calcium 9.1 8.9 - 95.6 mg/dL   Total Protein 7.5 6.5 - 8.1 g/dL   Albumin 3.4 (L) 3.5 - 5.0 g/dL   AST 12 (L) 15 - 41 U/L   ALT 11 (L) 14 - 54 U/L   Alkaline  Phosphatase 109 38 - 126 U/L   Total Bilirubin 0.8 0.3 - 1.2 mg/dL   GFR calc non Af Amer 31 (L) >60 mL/min   GFR calc Af Amer 36 (L) >60 mL/min   Anion gap 10 5 - 15  Urinalysis, Routine w reflex microscopic  Result Value Ref Range   Color, Urine YELLOW YELLOW   APPearance CLEAR CLEAR   Specific Gravity, Urine 1.020 1.005 - 1.030   pH 5.5  5.0 - 8.0   Glucose, UA NEGATIVE NEGATIVE mg/dL   Hgb urine dipstick SMALL (A) NEGATIVE   Bilirubin Urine NEGATIVE NEGATIVE   Ketones, ur NEGATIVE NEGATIVE mg/dL   Protein, ur 409 (A) NEGATIVE mg/dL   Nitrite NEGATIVE NEGATIVE   Leukocytes, UA NEGATIVE NEGATIVE  Urine microscopic-add on  Result Value Ref Range   Squamous Epithelial / LPF 0-5 (A) NONE SEEN   WBC, UA 0-5 0 - 5 WBC/hpf   RBC / HPF 0-5 0 - 5 RBC/hpf   Bacteria, UA MANY (A) NONE SEEN   Laboratory interpretation all normal except Mild anemia, some renal insufficiency, malnutrition    EKG  EKG Interpretation None       Radiology Ct Head Wo Contrast  Result Date: 05/24/2016 CLINICAL DATA:  Acute onset of flaccid left arm and high blood pressure. Initial encounter. EXAM: CT HEAD WITHOUT CONTRAST TECHNIQUE: Contiguous axial images were obtained from the base of the skull through the vertex without intravenous contrast. COMPARISON:  CT of the head performed 12/29/2013 FINDINGS: There is no evidence of acute infarction, mass lesion, or intra- or extra-axial hemorrhage on CT. Prominence of the ventricles and sulci reflects moderately severe cortical volume loss. Cerebellar atrophy is noted. Scattered periventricular and subcortical white matter change likely reflects small vessel ischemic microangiopathy. The brainstem and fourth ventricle are within normal limits. The basal ganglia are unremarkable in appearance. The cerebral hemispheres demonstrate grossly normal gray-white differentiation. No mass effect or midline shift is seen. There is no evidence of fracture; visualized osseous structures are unremarkable in appearance. The visualized portions of the orbits are within normal limits. The paranasal sinuses and mastoid air cells are well-aerated. No significant soft tissue abnormalities are seen. IMPRESSION: 1. No acute intracranial pathology seen on CT. 2. Moderately severe cortical volume loss and scattered small vessel  ischemic microangiopathy. Electronically Signed   By: Roanna Raider M.D.   On: 05/24/2016 05:06   Dg Chest Port 1 View  Result Date: 05/24/2016 CLINICAL DATA:  Hypertension.  Left arm weakness. EXAM: PORTABLE CHEST 1 VIEW COMPARISON:  02/03/2010. FINDINGS: Normal sized heart. Clear lungs. Vascular crowding due to a poor inspiration. Unremarkable bones. IMPRESSION: No acute abnormality. Electronically Signed   By: Beckie Salts M.D.   On: 05/24/2016 02:47    Procedures Procedures (including critical care time)  Medications Ordered in ED Medications  cloNIDine (CATAPRES) tablet 0.1 mg (0 mg Oral Hold 05/24/16 0254)     Initial Impression / Assessment and Plan / ED Course  I have reviewed the triage vital signs and the nursing notes.  Pertinent labs & imaging results that were available during my care of the patient were reviewed by me and considered in my medical decision making (see chart for details).  Clinical Course   Clonidine was ordered was ordered but not given because her BP improved on it's own.    Patient's chest x-ray shows the pneumonia that she had before is now resolved, she is already on Rocephin through the 16th for that. Her  catheterized urine today shows her UTI is also improved with the Rocephin. Her culture results are not totally returned yet. CT of her head does not show evidence of any acute stroke. Patient is nonverbal and bedridden and requires total care. She is DNR. At this point I do not feel like she will benefit from being admitted to the hospital. I think her main problem is that she has a weak cough and she's not clearing her secretions well.   Final Clinical Impressions(s) / ED Diagnoses   Final diagnoses:  Essential hypertension  LUE weakness    Plan discharge  Devoria Albe, MD, Concha Pyo, MD 05/24/16 [phone]

## 2016-05-24 NOTE — ED Notes (Addendum)
Spoke with patient's niece, that is also a Engineer, civil (consulting)nurse [place] a travel assignment in [place].  Kristie CowmanLisa Herbin, cell # [phone]; work [phone].  States that her Aunt normally is confused, talks with garbled speech, eats but has to be fed, and smiles.  Normally moves all extremities.  States that she believes that her Aunt had a stroke.

## 2016-05-24 NOTE — ED Triage Notes (Signed)
Pt is a resident of [facility] [place] who was sent to er for further evaluation of elevated bp and "flacid" left arm, per nursing notes and ems, staff noticed that pt's left arm was flacid two days ago, normally pt is contracted, pt was also noted to have elevated bp, pt was given two SL nitro two days ago, tonight staff decided to sent pt to er due to still being hypertensive, bp at nursing home was 230/140

## 2016-05-26 LAB — URINE CULTURE
Culture: NO GROWTH
Special Requests: NORMAL

## 2016-05-29 LAB — CULTURE, BLOOD (ROUTINE X 2)
CULTURE: NO GROWTH
CULTURE: NO GROWTH

## 2016-07-12 DEATH — deceased

## 2019-08-17 ENCOUNTER — Encounter: Payer: Self-pay | Admitting: Gastroenterology
